# Patient Record
Sex: Male | Born: 1960 | Race: White | Hispanic: No | Marital: Married | Smoking: Never smoker
Health system: Southern US, Community
[De-identification: ages and names within clinical notes are randomized; demographics above are authoritative.]

## PROBLEM LIST (undated history)

## (undated) DIAGNOSIS — C801 Malignant (primary) neoplasm, unspecified: Secondary | ICD-10-CM

## (undated) DIAGNOSIS — K219 Gastro-esophageal reflux disease without esophagitis: Secondary | ICD-10-CM

## (undated) DIAGNOSIS — R519 Headache, unspecified: Secondary | ICD-10-CM

## (undated) DIAGNOSIS — R011 Cardiac murmur, unspecified: Secondary | ICD-10-CM

## (undated) DIAGNOSIS — R748 Abnormal levels of other serum enzymes: Secondary | ICD-10-CM

## (undated) HISTORY — PX: WISDOM TOOTH EXTRACTION: SHX21

---

## 2007-08-31 HISTORY — PX: COLONOSCOPY: SHX174

## 2011-04-25 ENCOUNTER — Emergency Department (HOSPITAL_COMMUNITY)
Admission: EM | Admit: 2011-04-25 | Discharge: 2011-04-26 | Disposition: A | Discharge status: Home / Self Care | Payer: BC Managed Care – PPO | Attending: Emergency Medicine | Admitting: Emergency Medicine

## 2011-04-25 DIAGNOSIS — K8020 Calculus of gallbladder without cholecystitis without obstruction: Secondary | ICD-10-CM | POA: Insufficient documentation

## 2011-04-25 DIAGNOSIS — R112 Nausea with vomiting, unspecified: Secondary | ICD-10-CM | POA: Insufficient documentation

## 2011-04-25 DIAGNOSIS — R1011 Right upper quadrant pain: Secondary | ICD-10-CM | POA: Insufficient documentation

## 2011-04-25 LAB — CBC
MCHC: 36.2 g/dL — ABNORMAL HIGH (ref 30.0–36.0)
MCV: 83.2 fL (ref 78.0–100.0)
Platelets: 150 10*3/uL (ref 150–400)
RDW: 12.9 % (ref 11.5–15.5)
WBC: 10.5 10*3/uL (ref 4.0–10.5)

## 2011-04-25 LAB — COMPREHENSIVE METABOLIC PANEL
AST: 40 U/L — ABNORMAL HIGH (ref 0–37)
Albumin: 4.1 g/dL (ref 3.5–5.2)
Calcium: 9.5 mg/dL (ref 8.4–10.5)
Chloride: 105 mEq/L (ref 96–112)
Creatinine, Ser: 0.93 mg/dL (ref 0.50–1.35)
Total Bilirubin: 1.2 mg/dL (ref 0.3–1.2)
Total Protein: 7.1 g/dL (ref 6.0–8.3)

## 2011-04-25 LAB — DIFFERENTIAL
Basophils Absolute: 0 10*3/uL (ref 0.0–0.1)
Eosinophils Absolute: 0.1 10*3/uL (ref 0.0–0.7)
Eosinophils Relative: 1 % (ref 0–5)
Lymphs Abs: 1.2 10*3/uL (ref 0.7–4.0)

## 2011-04-25 LAB — POCT I-STAT TROPONIN I: Troponin i, poc: 0.07 ng/mL (ref 0.00–0.08)

## 2011-04-26 ENCOUNTER — Emergency Department (HOSPITAL_COMMUNITY): Payer: BC Managed Care – PPO

## 2011-05-10 ENCOUNTER — Ambulatory Visit (INDEPENDENT_AMBULATORY_CARE_PROVIDER_SITE_OTHER): Payer: BC Managed Care – PPO | Admitting: Surgery

## 2011-05-10 ENCOUNTER — Encounter (INDEPENDENT_AMBULATORY_CARE_PROVIDER_SITE_OTHER): Payer: Self-pay | Admitting: Surgery

## 2011-05-10 VITALS — BP 120/88 | HR 64 | Temp 97.6°F | Ht 66.5 in | Wt 161.8 lb

## 2011-05-10 DIAGNOSIS — K8010 Calculus of gallbladder with chronic cholecystitis without obstruction: Secondary | ICD-10-CM | POA: Insufficient documentation

## 2011-05-10 NOTE — Progress Notes (Signed)
Subjective:     Patient ID: Douglas Cole, male   DOB: Jan 05, 1961, 50 y.o.   MRN: [id number]  HPI  Reason for visit: Recurrent abdominal pain and nausea vomiting. A probable gallstone etiology.  Patient is a healthy male who's had a few intermittent attacks of abdominal pain. It had been at one time after having hotdogs. Another after grilled chicken. He noted diffuse upper abdominal pain. He was worse in the right side. He felt full and bloated. First episode he had nausea vomiting.  The second episode was just a fullness and tightness, but it lasted longer. He went to the emergency room. Workup showed gallstones. He had a mildly increased AST. Rest of workup is underwhelming. The history and ultrasound and exam findings were strongly suspicious for biliary colic. They sent this patient to me for evaluation.  He has a bowel movement every day. No history of inflammatory bowel disease, irritable bowel syndrome, polyps. Normal colonoscopy last year by [facility]. No sick contacts. No travel history. Minimal reflux. No heartburn. He is not taking medicine for any reflux. These attacks were different.  He has 2-3 glasses of wine a week at most. No family history of other GI problems.  History reviewed. No pertinent past medical history. History reviewed. No pertinent past surgical history. No current outpatient prescriptions on file. Allergies  Allergen Reactions  . Penicillins      Review of Systems  Constitutional: Negative for fever, chills and diaphoresis.  HENT: Negative for nosebleeds, sore throat, facial swelling, mouth sores, trouble swallowing and ear discharge.   Eyes: Negative for photophobia, discharge and visual disturbance.  Respiratory: Negative for choking, chest tightness, shortness of breath and stridor.   Cardiovascular: Negative for chest pain and palpitations.  Gastrointestinal: Positive for nausea, vomiting and abdominal pain. Negative for diarrhea, constipation, blood in  stool, abdominal distention, anal bleeding and rectal pain.  Genitourinary: Negative for dysuria, urgency, frequency, difficulty urinating and testicular pain.  Musculoskeletal: Negative for myalgias, back pain, arthralgias and gait problem.  Skin: Negative for color change, pallor, rash and wound.  Neurological: Negative for dizziness, speech difficulty, weakness, numbness and headaches.  Hematological: Negative for adenopathy. Does not bruise/bleed easily.  Psychiatric/Behavioral: Negative for hallucinations, confusion and agitation.       Objective:   Physical Exam  Constitutional: He is oriented to person, place, and time. He appears well-developed and well-nourished. No distress.  HENT:  Head: Normocephalic.  Mouth/Throat: Oropharynx is clear and moist. No oropharyngeal exudate.  Eyes: Conjunctivae and EOM are normal. Pupils are equal, round, and reactive to light. No scleral icterus.  Neck: Normal range of motion. Neck supple. No tracheal deviation present.  Cardiovascular: Normal rate, regular rhythm and intact distal pulses.   Pulmonary/Chest: Effort normal and breath sounds normal. No respiratory distress.  Abdominal: Soft. He exhibits no distension and no mass. There is no rebound and no guarding. Hernia confirmed negative in the right inguinal area and confirmed negative in the left inguinal area.       MIld RUQ TTP subcostal   Musculoskeletal: Normal range of motion. He exhibits no tenderness.  Lymphadenopathy:    He has no cervical adenopathy.       Right: No inguinal adenopathy present.       Left: No inguinal adenopathy present.  Neurological: He is alert and oriented to person, place, and time. No cranial nerve deficit. He exhibits normal muscle tone. Coordination normal.  Skin: Skin is warm and dry. No rash noted. He  is not diaphoretic. No erythema. No pallor.  Psychiatric: He has a normal mood and affect. His behavior is normal. Judgment and thought content normal.        Assessment:     Probable chronic cholecystitis with gallstones    Plan:     I think he would benefit from surgery. He is a good candidate for a single site approach. I do not think he needs a GI evaluation at this time.  The anatomy & physiology of hepatobiliary & pancreatic function was discussed.  The pathophysiology of gallbladder dysfunction was discussed.  Natural history risks without surgery was discussed.   I feel the risks of no intervention will lead to serious problems that outweigh the operative risks; therefore, I recommended cholecystectomy to remove the pathology.  I explained laparoscopic techniques with possible need for an open approach.  Probable cholangiogram to evaluate the bilary tract was explained as well.    Risks such as bleeding, infection, abscess, leak, injury to other organs, need for further treatment, heart attack, death, and other risks were discussed.  Possibility that this will not correct all abdominal symptoms was explained.  Goals of post-operative recovery were discussed as well.  We will work to minimize complications.  An educational handout further explaining the pathology and treatment options was given as well.  Questions were answered.  The patient expresses understanding & wishes to proceed with surgery.

## 2011-05-10 NOTE — Patient Instructions (Signed)
See Handout  Consider Surgery

## 2011-05-28 ENCOUNTER — Ambulatory Visit (HOSPITAL_COMMUNITY): Payer: BC Managed Care – PPO

## 2011-05-28 ENCOUNTER — Other Ambulatory Visit (INDEPENDENT_AMBULATORY_CARE_PROVIDER_SITE_OTHER): Payer: Self-pay | Admitting: Surgery

## 2011-05-28 ENCOUNTER — Ambulatory Visit (HOSPITAL_COMMUNITY)
Admission: RE | Admit: 2011-05-28 | Discharge: 2011-05-30 | Disposition: A | Discharge status: Home / Self Care | Payer: BC Managed Care – PPO | Source: Ambulatory Visit | Attending: Surgery | Admitting: Surgery

## 2011-05-28 DIAGNOSIS — K8064 Calculus of gallbladder and bile duct with chronic cholecystitis without obstruction: Secondary | ICD-10-CM | POA: Insufficient documentation

## 2011-05-28 DIAGNOSIS — Z79899 Other long term (current) drug therapy: Secondary | ICD-10-CM | POA: Insufficient documentation

## 2011-05-28 DIAGNOSIS — K8060 Calculus of gallbladder and bile duct with cholecystitis, unspecified, without obstruction: Secondary | ICD-10-CM | POA: Insufficient documentation

## 2011-05-28 DIAGNOSIS — K824 Cholesterolosis of gallbladder: Secondary | ICD-10-CM

## 2011-05-28 DIAGNOSIS — K8010 Calculus of gallbladder with chronic cholecystitis without obstruction: Secondary | ICD-10-CM

## 2011-05-28 HISTORY — PX: CHOLECYSTECTOMY: SHX55

## 2011-05-28 LAB — CBC
HCT: 39.3 % (ref 39.0–52.0)
Hemoglobin: 13.7 g/dL (ref 13.0–17.0)
MCV: 84.3 fL (ref 78.0–100.0)
RBC: 4.66 MIL/uL (ref 4.22–5.81)
WBC: 4.4 10*3/uL (ref 4.0–10.5)

## 2011-05-28 LAB — COMPREHENSIVE METABOLIC PANEL
CO2: 27 mEq/L (ref 19–32)
Chloride: 102 mEq/L (ref 96–112)
Glucose, Bld: 98 mg/dL (ref 70–99)
Potassium: 3.7 mEq/L (ref 3.5–5.1)
Sodium: 139 mEq/L (ref 135–145)
Total Bilirubin: 0.8 mg/dL (ref 0.3–1.2)

## 2011-05-28 LAB — SURGICAL PCR SCREEN: Staphylococcus aureus: NEGATIVE

## 2011-05-29 ENCOUNTER — Ambulatory Visit (HOSPITAL_COMMUNITY): Payer: BC Managed Care – PPO

## 2011-05-29 LAB — CBC
HCT: 40 % (ref 39.0–52.0)
Hemoglobin: 13.9 g/dL (ref 13.0–17.0)
MCV: 84.6 fL (ref 78.0–100.0)
Platelets: 140 10*3/uL — ABNORMAL LOW (ref 150–400)
RBC: 4.73 MIL/uL (ref 4.22–5.81)
WBC: 10.9 10*3/uL — ABNORMAL HIGH (ref 4.0–10.5)

## 2011-05-29 LAB — HEPATIC FUNCTION PANEL
ALT: 104 U/L — ABNORMAL HIGH (ref 0–53)
AST: 80 U/L — ABNORMAL HIGH (ref 0–37)
Bilirubin, Direct: 0.3 mg/dL (ref 0.0–0.3)

## 2011-05-29 LAB — PROTIME-INR
INR: 1.15 (ref 0.00–1.49)
Prothrombin Time: 14.9 seconds (ref 11.6–15.2)

## 2011-05-29 LAB — CREATININE, SERUM: GFR calc Af Amer: >60 mL/min (ref >60)

## 2011-05-29 LAB — TYPE AND SCREEN

## 2011-05-30 LAB — CBC
HCT: 33.9 % — ABNORMAL LOW (ref 39.0–52.0)
Hemoglobin: 11.6 g/dL — ABNORMAL LOW (ref 13.0–17.0)
MCH: 29.4 pg (ref 26.0–34.0)
MCHC: 34.2 g/dL (ref 30.0–36.0)

## 2011-05-30 LAB — COMPREHENSIVE METABOLIC PANEL
BUN: 16 mg/dL (ref 6–23)
Calcium: 8.9 mg/dL (ref 8.4–10.5)
Creatinine, Ser: 0.79 mg/dL (ref 0.50–1.35)
GFR calc Af Amer: >60 mL/min (ref >60)
Glucose, Bld: 119 mg/dL — ABNORMAL HIGH (ref 70–99)
Sodium: 136 mEq/L (ref 135–145)
Total Protein: 6.1 g/dL (ref 6.0–8.3)

## 2011-06-10 ENCOUNTER — Encounter (INDEPENDENT_AMBULATORY_CARE_PROVIDER_SITE_OTHER): Payer: Self-pay

## 2011-06-14 ENCOUNTER — Ambulatory Visit (INDEPENDENT_AMBULATORY_CARE_PROVIDER_SITE_OTHER): Payer: BC Managed Care – PPO | Admitting: Surgery

## 2011-06-14 ENCOUNTER — Encounter (INDEPENDENT_AMBULATORY_CARE_PROVIDER_SITE_OTHER): Payer: Self-pay | Admitting: Surgery

## 2011-06-14 VITALS — BP 118/88 | HR 64 | Temp 97.5°F | Resp 20 | Ht 67.0 in | Wt 161.1 lb

## 2011-06-14 DIAGNOSIS — K8050 Calculus of bile duct without cholangitis or cholecystitis without obstruction: Secondary | ICD-10-CM

## 2011-06-14 DIAGNOSIS — K8010 Calculus of gallbladder with chronic cholecystitis without obstruction: Secondary | ICD-10-CM

## 2011-06-14 NOTE — Consult Note (Signed)
  NAMEROLAND, LIPKE NO.:  [id number]  MEDICAL RECORD NO.:  [id number]  LOCATION:  1523                         FACILITY:  [facility]  PHYSICIAN:  Shirley Friar, MDDATE OF BIRTH:  06-23-1961  DATE OF CONSULTATION: DATE OF DISCHARGE:                                CONSULTATION   REQUESTING PHYSICIAN:  Ardeth Sportsman, MD  INDICATIONS:  Common bile duct stones.  HISTORY OF PRESENT ILLNESS:  Mr. Rentz is a pleasant 50 year old white male who underwent a laparoscopic cholecystectomy yesterday due to symptomatic gallstones.  He had a positive intraoperative cholangiogram, which showed multiple small filling defects in the distal common bile duct with mild biliary ductal dilation of the common bile duct.  Two weeks ago, he had a mildly elevated AST of 40, otherwise normal ALT 24, ALP 47, and T bili at upper limit of normal is 1.2.  Preoperatively, his liver function tests were within normal limits.  Postoperatively today, his T bili is 1.6, ALP 62, AST 80, ALT 104, and lipase 12.  He feels okay postoperatively with some mild tenderness mainly in the lower quadrants.  PAST MEDICAL HISTORY:  Negative.  ALLERGIES:  PENICILLIN causes hives.  MEDICATIONS:  Currently, Tylenol as needed, Robinul as needed, Toradol as needed, Maalox as needed, morphine as needed, Zofran as needed, and Percocet as needed.  Doses listed in the hospital record.  FAMILY HISTORY:  Noncontributory.  SOCIAL HISTORY:  Positive alcohol, denies smoking.  REVIEW OF SYSTEMS:  Negative from GI standpoint except as stated above.  PHYSICAL EXAMINATION:  VITAL SIGNS:  Temperature 98, pulse 82, blood pressure 114/75, O2 sat 98% on room air. GENERAL:  Alert, well-nourished, in no acute stress ABDOMEN:  Mild tenderness with guarding in upper quadrants, soft, nondistended, and positive bowel sounds.  LABORATORY DATA:  White blood count 10.9, hemoglobin 13.9, platelet count 140, INR 1.15, T bili  1.6, ALP 62, AST 80, ALT 104, and lipase 12.  IMPRESSION:  A 50 year old white male with a positive intraoperative cholangiogram following a laparoscopic cholecystectomy yesterday that showed multiple small filling defects likely common bile duct stones. He needs ERCP, which will be done today to remove the stones.  Risks and benefits of procedure were discussed with Mr. Corter and his wife and they agreed to proceed.  We will give him Cipro and call endoscopy due to a penicillin allergy, which could have possible cross reactivity with cephalosporins.  Continue IV fluids, n.p.o.  ERCP will be done by Dr. Evette Cristal.     Shirley Friar, MD     VCS/MEDQ  D:  05/29/2011  T:  05/29/2011  Job:  [id number]  cc:   Ardeth Sportsman, MD [address] [place] [address]  Graylin Shiver, M.D. Fax: [phone]  Electronically Signed by Charlott Rakes MD on 06/14/2011 07:54:32 PM

## 2011-06-14 NOTE — Progress Notes (Signed)
Subjective:     Patient ID: Douglas Cole, male   DOB: [birth year], 50 y.o.   MRN: [id number]  HPI  Patient Care Team: Dibas Koirala as PCP - General (Family Medicine) Freddy Jaksch, MD as Consulting Physician (Gastroenterology)  This patient is a 50 y.o.male who presents today for surgical evaluation.   Diagnosis: Chronic cholecystitis and common bile duct stones  Procedure: Single site laparoscopic cholecystectomy with postoperative ERCP  The patient comes today feeling okay. Appetite coming back. Minimal pain. Minimal drainage at the bellybutton covered with a daily Band-Aid. Bowels moving regularly. Tolerating a regular diet well. Back to work. No episodes of jaundice  History reviewed. No pertinent past medical history.  Past Surgical History  Procedure Date  . Cholecystectomy 05/28/11     lap chole     History   Social History  . Marital Status: Married    Spouse Name: N/A    Number of Children: N/A  . Years of Education: N/A   Occupational History  . Not on file.   Social History Main Topics  . Smoking status: Never Smoker   . Smokeless tobacco: Not on file  . Alcohol Use: Yes  . Drug Use: No  . Sexually Active:    Other Topics Concern  . Not on file   Social History Narrative  . No narrative on file    Family History  Problem Relation Age of Onset  . Cancer Mother     kidney    No current outpatient prescriptions on file.  Allergies  Allergen Reactions  . Penicillins        Review of Systems  Constitutional: Negative for fever, chills and diaphoresis.  HENT: Negative for sore throat, trouble swallowing and neck pain.   Eyes: Negative for photophobia and visual disturbance.  Respiratory: Negative for choking and shortness of breath.   Cardiovascular: Negative for chest pain and palpitations.  Gastrointestinal: Negative for nausea, vomiting, abdominal distention, anal bleeding and rectal pain.  Genitourinary: Negative for dysuria, urgency,  difficulty urinating and testicular pain.  Musculoskeletal: Negative for myalgias, arthralgias and gait problem.  Skin: Negative for color change and rash.  Neurological: Negative for dizziness, speech difficulty, weakness and numbness.  Hematological: Negative for adenopathy.  Psychiatric/Behavioral: Negative for hallucinations, confusion and agitation.       Objective:   Physical Exam  Constitutional: He is oriented to person, place, and time. He appears well-developed and well-nourished. No distress.  HENT:  Head: Normocephalic.  Mouth/Throat: Oropharynx is clear and moist. No oropharyngeal exudate.  Eyes: Conjunctivae and EOM are normal. Pupils are equal, round, and reactive to light. No scleral icterus.  Neck: Normal range of motion. No tracheal deviation present.  Cardiovascular: Normal rate, normal heart sounds and intact distal pulses.   Pulmonary/Chest: Effort normal. No respiratory distress.  Abdominal: Soft. He exhibits no distension. There is no tenderness. Hernia confirmed negative in the right inguinal area and confirmed negative in the left inguinal area.       Incisions clean with normal healing ridge.  Minimal moist scab.  No hernia, cellulitis, abscess  Musculoskeletal: Normal range of motion. He exhibits no tenderness.  Neurological: He is alert and oriented to person, place, and time. No cranial nerve deficit. He exhibits normal muscle tone. Coordination normal.  Skin: Skin is warm and dry. No rash noted. He is not diaphoretic.  Psychiatric: He has a normal mood and affect. His behavior is normal.       Assessment:  2-1/2 weeks status post cholecystectomy and ERCP for stones in gallbladder and common bile duct. Improving.    Plan:     Increase activity as tolerated.  Do not push through pain.  Dry cotton ball or dressing over incision to let it dry out. Return if it worsens or does not improve.  Advanced on diet as tolerated. Bowel regimen to avoid  problems.  Return to clinic p.r.n. The patient expressed understanding and appreciation

## 2011-06-14 NOTE — Patient Instructions (Signed)
Increase activity as tolerated.  Do not push through pain.  Advanced on diet as tolerated. Bowel regimen to avoid problems.

## 2011-06-17 NOTE — Op Note (Signed)
[name], Cole NO.:  [id number]  MEDICAL RECORD NO.:  [id number]  LOCATION:  1523                         FACILITY:  [facility]  PHYSICIAN:  Ardeth Sportsman, MD     DATE OF BIRTH:  27-Aug-1961  DATE OF PROCEDURE:  05/28/2011 DATE OF DISCHARGE:                              OPERATIVE REPORT   PRIMARY CARE PHYSICIAN:  Dibas Koirala, MD.  SURGEON:  Ardeth Sportsman, MD.  ASSISTANT:  RN.  PREOPERATIVE DIAGNOSIS:  Symptomatic cholecystolithiasis with probable chronic cholecystitis.  POSTOPERATIVE DIAGNOSES: 1. Chronic cholecystitis with gall stones. 2. Choledocholithiasis, not fully obstruction with biliary dilation.  PROCEDURES PERFORMED:  Laparoscopic cholecystectomy with intraoperative cholangiogram (single-site technique).  SPECIMEN:  Gallbladder.  DRAINS:  None.  ESTIMATED BLOOD LOSS:  Minimal.  COMPLICATIONS:  None.  INDICATIONS:  Douglas Cole is a 50 year old male who has had a few intermittent attacks of abdominal pain, especially after protein.  It usually has been upper abdominal, but it has become more focussed within the right side.  He has had nausea and vomiting associated with it.  He had an elevated AST after a visit to ER.  Ultrasound did not show ductal dilatation and showed sludge and/or stones.  He was sent to me for surgical evaluation.  His differential diagnosis did not seem very likely.  The anatomy and physiology of hepatobiliary and pancreatic function was discussed.  Pathophysiology of gallstones with her natural history and risks of cholecystitis was discussed.  Options were discussed. Recommendation is made for her laparoscopic cholecystectomy with intraoperative cholangiogram.  Risks, benefits, and alternatives were discussed.  I thought he would be reasonable candidate for a single-site technique versus conversion to standard multiple port.  Possible single site versus open was discussed.  Risks, benefits, and alternatives  were discussed.  Questions answered and he agreed to proceed.  OPERATIVE FINDINGS:  He had a dilated bulky gallbladder.  He had some sludge and may be a few stones in the gallbladder.  His liver otherwise looked normal.  He had moderate adhesions to the gallbladder concerning for probable chronic cholecystitis.  He did have a diffusely dilated biliary system.  He had many polyhedral irregular lucencies in his mid and distal common bile duct strongly suspicious for common bile duct stones.  Contrast could get into the duodenum without much difficulty.  DESCRIPTION OF PROCEDURE:  Informed consent was confirmed.  Patient underwent general anesthesia without any difficulty.  He was positioned supine with arms tucked.  He had sequential compression devices active during the entire case.  Abdomen was clipped, prepped, and draped in sterile fashion.  Surgical time-out confirmed our plan.  I made a curvilinear incision to the superior part of the umbilicus.  I did a cut down and placed a #5 long port through the supraumbilical fascia just superior to the umbilicus.  Entering was clean.  I induced carbon dioxide insufflation.  Camera inspection revealed no injury.  I did inspection and saw no adhesions to the anterior abdominal wall circumferentially.  I could see omentum here in the right upper quadrant.  I placed a 5 mm port in the left upper aspect of the wound and  5 mm grasper in the right inferior aspect of the wound.  I turned attention towards the right upper quadrant.  I could not grab the gallbladder initially as it was too dilated.  I found greater omentum and epiploic appendages of the transverse colon adhering to the gallbladder.  I freed that off using focused ultrasonic dissection and blunt dissection.  Eventually, I freed enough off, so I could grab the gallbladder and better elevate it cephalad.  I freed the greater omentum, transverse colon, and duodenum off the anterior  wall of the gallbladder to better elevate in cephalad.  I freed the peritoneal attachments in the gallbladder and the liver and the posterior lateral wall and came around anteriorly.  I  alternated between blunt [place] dissection and focussed ultrasonic dissection to help mobilize proximal half of the gallbladder off the liver bed.  I got a good critical view and ligated dominant cystic artery high on the gallbladder side.  I did further skeletonization and skeletonized about 3 cm of cystic duct which was not particularly enlarged.  I placed a clip on the infundibulum and did a partial cystic ductotomy. I passed a #5-French cholangiogram catheter through the puncture site in the right subcostal ridge into the cystic duct.  I ran a cholangiogram using dilute radiopaque contrast and continued with fluoroscopy.  The contrast flowed well from the side branch consistent with cystic duct cannulation and entered into a dilated biliary system and refluxed into the right and left enterohepatic chains. It came down the common bile duct and there were some obvious polyhedral lucencies, 2 to 5 mm in size.  They seemed consistent with stones.  I did get some contrast going into the duodenum although it was not frankly brisk.  I re-ran the cholangiogram after doing some flushing, it was noted that the stones persisted.  Because the stones were on the larger side and the cystic duct was not particularly dilated, I did not think it would be a good idea to try transcystic common bile duct exploration, and therefore decided to consider postoperative ERCT.  I clipped the cystic duct with 4 clips.  I transected the gallbladder off the cystic duct.  I ligated the cystic duct stump with a 0 PDS Endoloop.  I freed the gallbladder from its remaining attachments off the liver bed.  I did meticulous hemostasis with spatula tip cautery on the gallbladder fossa of the liver.  I did copious irrigation over  a liter of saline with good clear return.  There was good hemostasis and no evidence of any injuries to other organs.  I grabbed the gallbladder and removed it out of the midline supraumbilical wound after having have to sharply open up the fascia transversally to 1.5 cm to get the second gallbladder wall out and after aspirating the bile to help decompress the gallbladder to finally get that out as well.  There were few stones in the gallbladder as well.  I did copious irrigation of the umbilical wound.  I reapproximated the fascia using 0 Vicryl interrupted sutures transversally and closed the skin 4-0 Monocryl stitch.  Sterile dressing was applied.  The patient was extubated and taken to the recovery room in stable .  I am going to try and reach Gastroenterology to see about ERCP at this admission or soon.  I also discussed the operative findings with patient's family.     Ardeth Sportsman, MD     SCG/MEDQ  D:  05/28/2011  T:  05/29/2011  Job:  [id number]  cc:   Darrow Bussing, MD Fax: [phone]  Electronically Signed by Karie Soda MD on 06/17/2011 11:07:33 AM

## 2011-06-23 ENCOUNTER — Encounter (INDEPENDENT_AMBULATORY_CARE_PROVIDER_SITE_OTHER): Payer: BC Managed Care – PPO | Admitting: Surgery

## 2012-02-16 IMAGING — RF DG ERCP WO/W SPHINCTEROTOMY
8 series · 15 of 15 positions shown · non-contrast
Comparison: Intraoperative cholangiogram 05/28/2011.

CLINICAL DATA: Possible common duct stone.

ERCP
TECHNIQUE: Multiple spot images obtained with the fluoroscopic
device and submitted for interpretation post-procedure.  ERCP was
performed by Dr. Diljith.

[Series 1: cont. · 1 of 1 slices shown (1 of 8)]
[im 1/1]
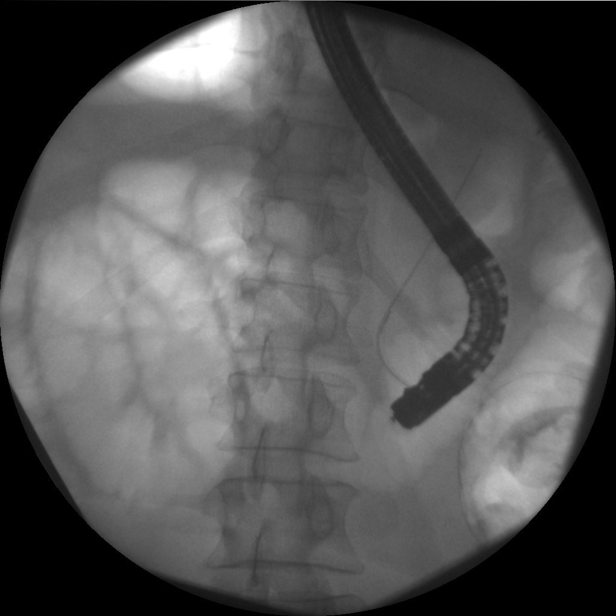

[Series 2: cont. · 2 of 2 slices shown (2 of 8)]
[im 1/2]
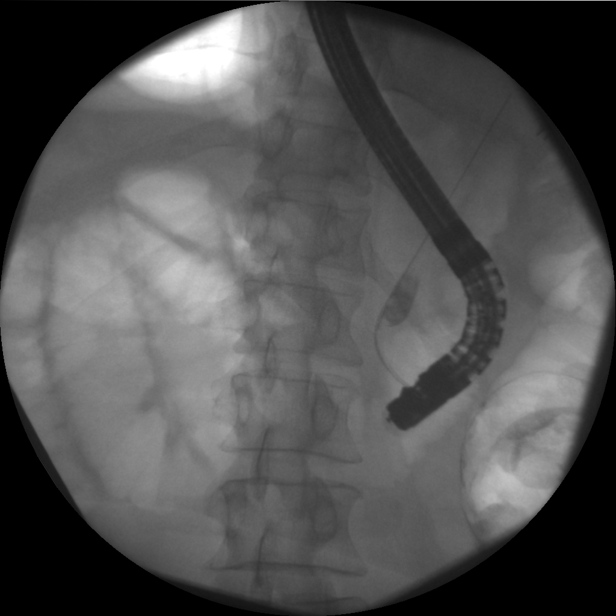
[im 2/2]
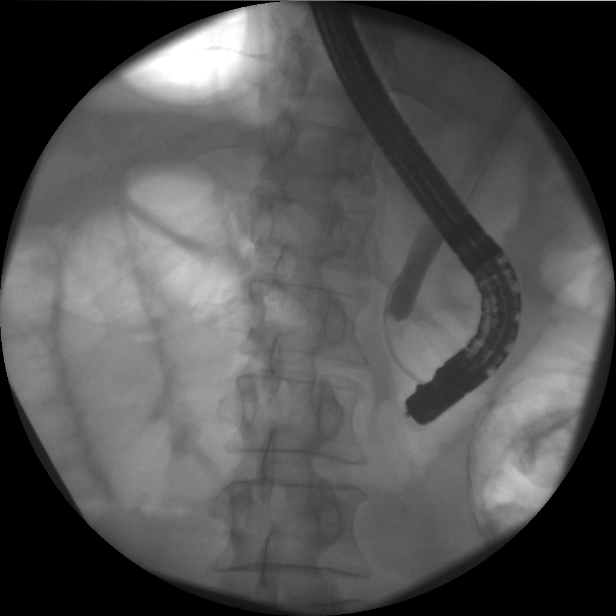

[Series 3: cont. · 1 of 1 slices shown (3 of 8)]
[im 1/1]
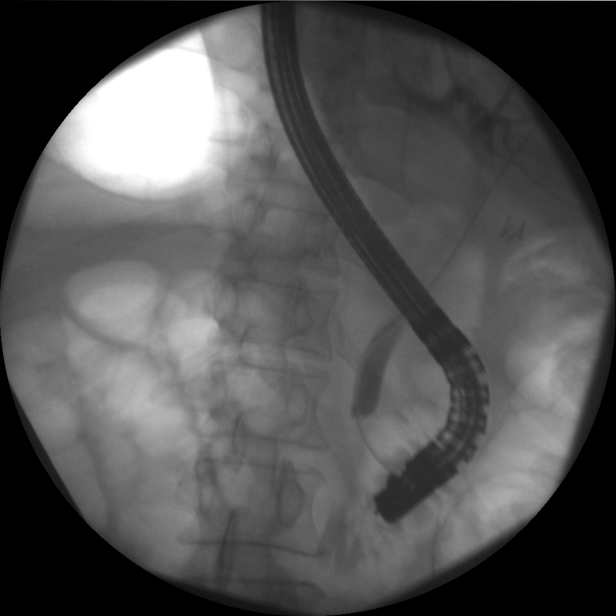

[Series 4: cont. · 2 of 2 slices shown (4 of 8)]
[im 1/2]
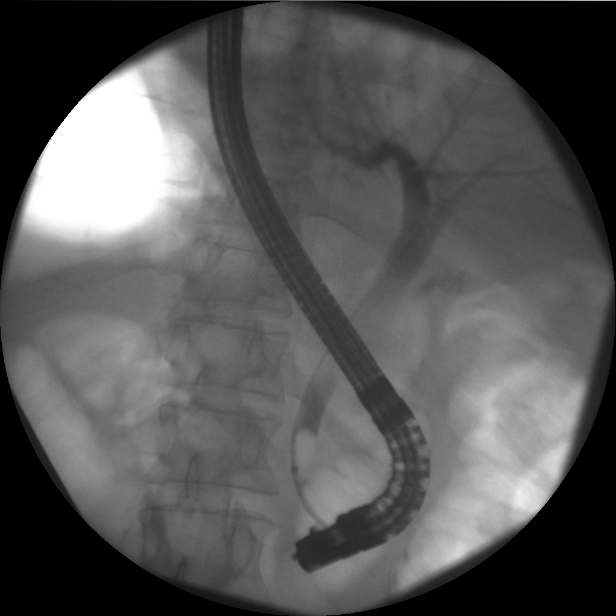
[im 2/2]
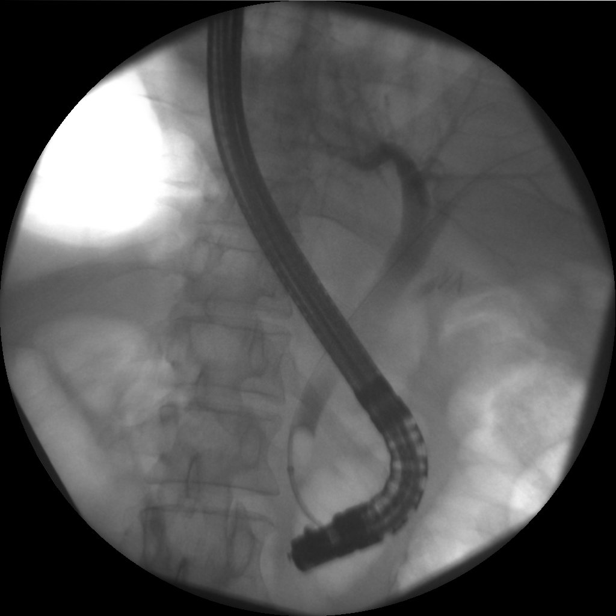

[Series 5: cont. · 1 of 1 slices shown (5 of 8)]
[im 1/1]
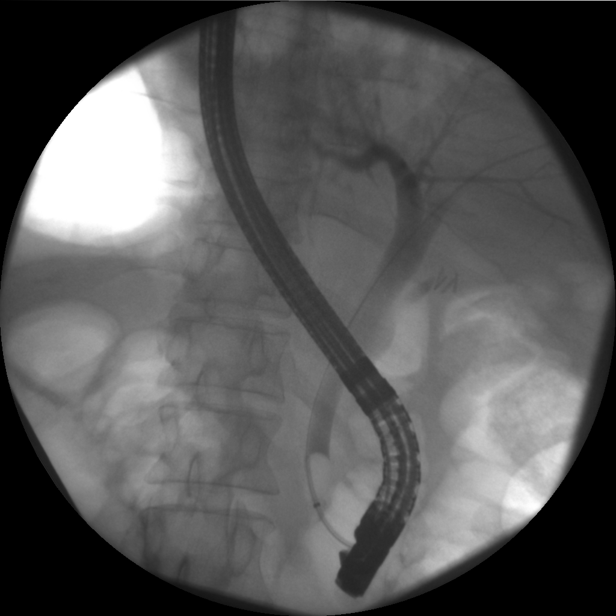

[Series 6: cont. · 3 of 3 slices shown (6 of 8)]
[im 1/3]
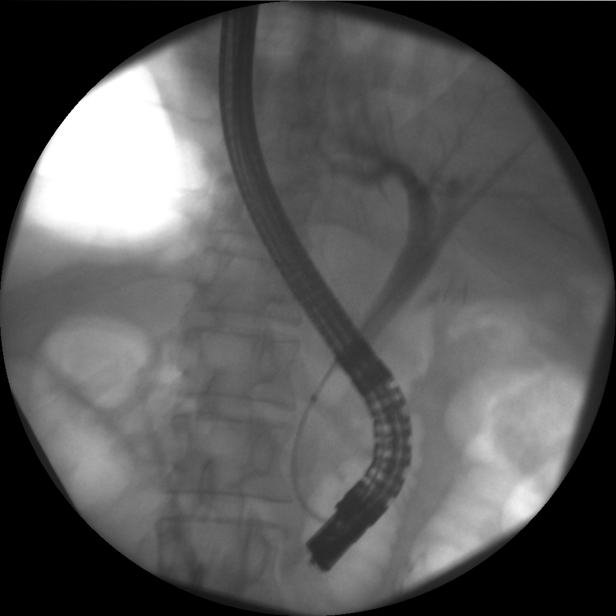
[im 2/3]
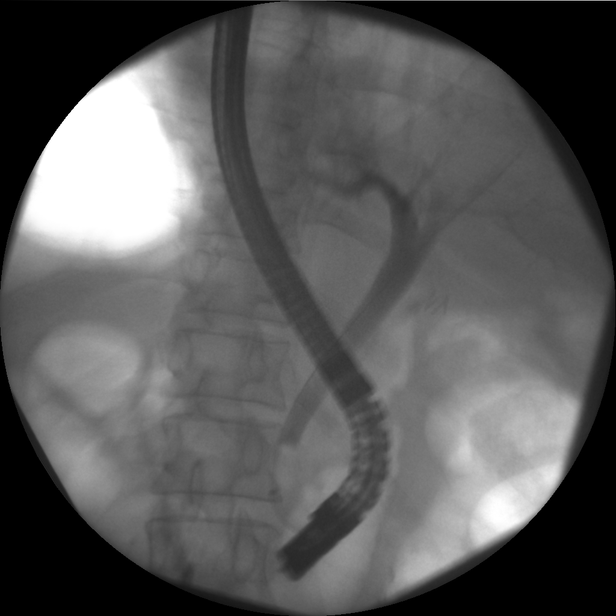
[im 3/3]
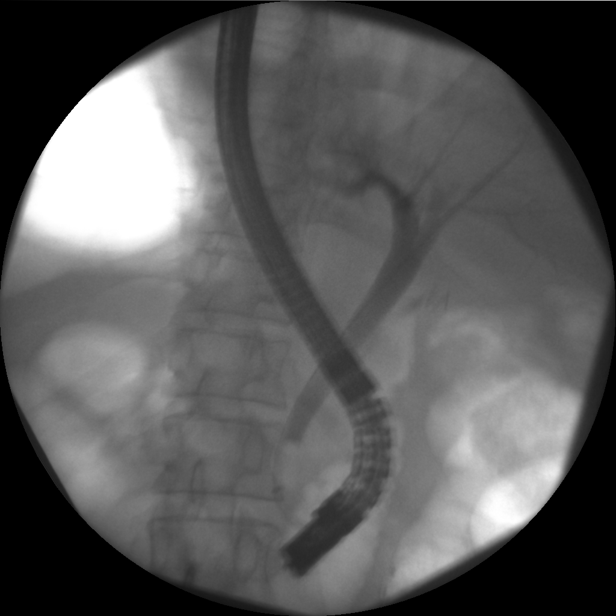

[Series 7: cont. · 4 of 4 slices shown (7 of 8)]
[im 1/4]
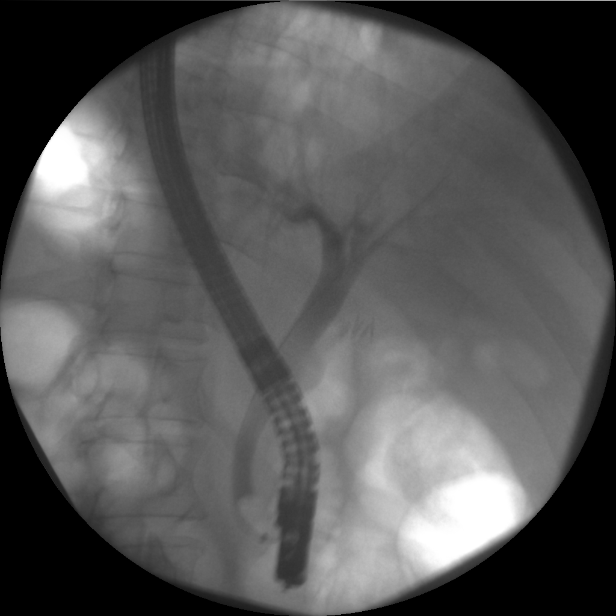
[im 2/4]
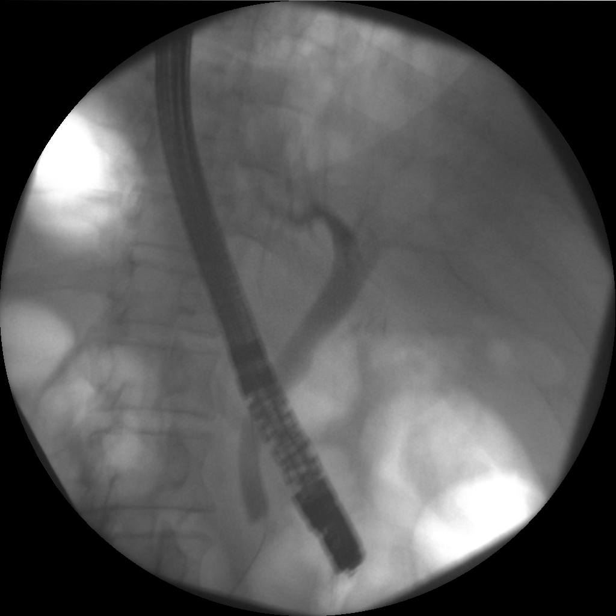
[im 3/4]
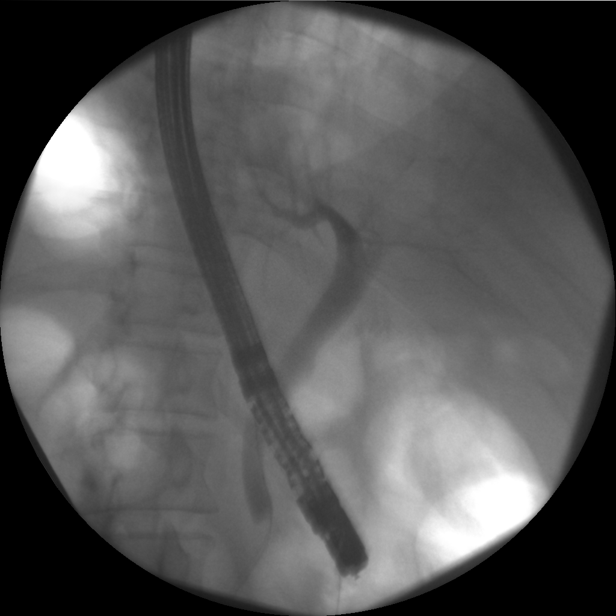
[im 4/4]
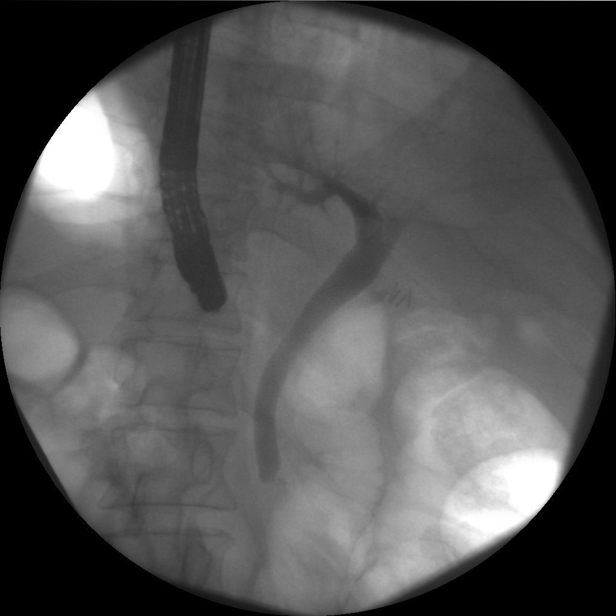

[Series 8: cont. · 1 of 1 slices shown (8 of 8)]
[im 1/1]
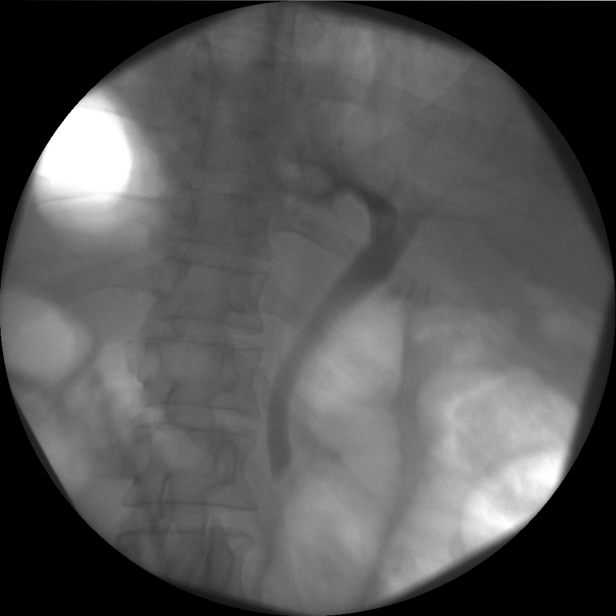

[15 of 15 positions shown; findings below may reference images not displayed]

FINDINGS: We are provided with a series of fluoroscopic spot views
from endoscopy.  Images demonstrate an endoscope in place.  The
common bile duct is cannulated and balloon sweep was performed.
Filling defect in the distal common duct seen on the second image
is no longer visualized at the end of the exam.
IMPRESSION: ERCP as above.

These images were submitted for radiologic interpretation only.
Please see the procedural report for the amount of contrast and the
fluoroscopy time utilized.

## 2012-05-31 ENCOUNTER — Ambulatory Visit (INDEPENDENT_AMBULATORY_CARE_PROVIDER_SITE_OTHER): Payer: BC Managed Care – PPO | Admitting: Surgery

## 2012-05-31 VITALS — BP 126/80 | HR 90 | Temp 98.6°F | Resp 18 | Ht 67.0 in | Wt 164.8 lb

## 2012-05-31 DIAGNOSIS — K601 Chronic anal fissure: Secondary | ICD-10-CM | POA: Insufficient documentation

## 2012-05-31 DIAGNOSIS — K602 Anal fissure, unspecified: Secondary | ICD-10-CM

## 2012-05-31 HISTORY — DX: Chronic anal fissure: K60.1

## 2012-05-31 MED ORDER — AMBULATORY NON FORMULARY MEDICATION: 1.0000 "application " | Freq: Four times a day (QID) | Status: DC

## 2012-05-31 NOTE — Progress Notes (Signed)
Subjective:     Patient ID: Douglas Cole, male   DOB: 1960/12/30, 51 y.o.   MRN: [id number]  HPI  Douglas Cole  08/12/1961 [id number]  Patient Care Team: Darrow Bussing, MD as PCP - General (Family Medicine) Willis Modena, MD as Consulting Physician (Gastroenterology)  This patient is a 51 y.o.male who presents today for surgical evaluation at the request of Dr. Docia Chuck.   Reason for evaluation: Anal pain.  Probable hemorrhoids.  Pleasant male.  Struggled with intermittent anal pain for several years.  More recently has become frustrating.  Notes he feels like he is " crapping glass".  Some blood in stools.  Tries to avoid constipation.  No history of IBS.  Has tried Anusol cream without much help.  Has softer stools now.  Still having pain.  Seems to be more on the tailbone side.  No personal nor family history of GI/colon cancer, inflammatory bowel disease, irritable bowel syndrome, allergy such as Celiac Sprue, dietary/dairy problems, colitis, ulcers nor gastritis.    No recent sick contacts/gastroenteritis.  No travel outside the country.  No changes in diet.    Patient Active Problem List  Diagnosis  . Chronic anal fissure    No past medical history on file.  Past Surgical History  Procedure Date  . Cholecystectomy 05/28/11     lap chole     History   Social History  . Marital Status: Married    Spouse Name: N/A    Number of Children: N/A  . Years of Education: N/A   Occupational History  . Not on file.   Social History Main Topics  . Smoking status: Never Smoker   . Smokeless tobacco: Not on file  . Alcohol Use: Yes  . Drug Use: No  . Sexually Active:    Other Topics Concern  . Not on file   Social History Narrative  . No narrative on file    Family History  Problem Relation Age of Onset  . Cancer Mother     kidney    Current Outpatient Prescriptions  Medication Sig Dispense Refill  . AMBULATORY NON FORMULARY MEDICATION Place 1 application  rectally 4 (four) times daily. Diltiazem 2% compounded suspension.  1 Tube  2     Allergies  Allergen Reactions  . Penicillins     BP 126/80  Pulse 90  Temp 98.6 F (37 C) (Temporal)  Resp 18  Ht 5\' 7"  (1.702 m)  Wt 164 lb 12.8 oz (74.753 kg)  BMI 25.81 kg/m2  No results found.   Review of Systems  Constitutional: Negative for fever, chills and diaphoresis.  HENT: Negative for nosebleeds, sore throat, facial swelling, mouth sores, trouble swallowing and ear discharge.   Eyes: Negative for photophobia, discharge and visual disturbance.  Respiratory: Negative for choking, chest tightness, shortness of breath and stridor.   Cardiovascular: Negative for chest pain and palpitations.  Gastrointestinal: Positive for anal bleeding and rectal pain. Negative for nausea, vomiting, abdominal pain, diarrhea, constipation, blood in stool and abdominal distention.  Genitourinary: Negative for dysuria, urgency, difficulty urinating and testicular pain.  Musculoskeletal: Negative for myalgias, back pain, arthralgias and gait problem.  Skin: Negative for color change, pallor, rash and wound.  Neurological: Negative for dizziness, speech difficulty, weakness, numbness and headaches.  Hematological: Negative for adenopathy. Does not bruise/bleed easily.  Psychiatric/Behavioral: Negative for hallucinations, confusion and agitation.       Objective:   Physical Exam  Constitutional: He is oriented to person, place, and  time. He appears well-developed and well-nourished. No distress.  HENT:  Head: Normocephalic.  Mouth/Throat: Oropharynx is clear and moist. No oropharyngeal exudate.  Eyes: Conjunctivae normal and EOM are normal. Pupils are equal, round, and reactive to light. No scleral icterus.  Neck: Normal range of motion. Neck supple. No tracheal deviation present.  Cardiovascular: Normal rate, regular rhythm and intact distal pulses.   Pulmonary/Chest: Effort normal and breath sounds  normal. No respiratory distress.  Abdominal: Soft. He exhibits no distension. There is no tenderness. Hernia confirmed negative in the right inguinal area and confirmed negative in the left inguinal area.  Genitourinary: Penis normal. No penile tenderness.       Exam done with assistance of male Medical Assistant in the room.  Perianal skin clean with good hygiene.  No pruritis.  No external skin tags / hemorrhoids of significance.  No pilonidal disease.  No abscess/fistula.    Posterior midline anal fissure with sentinel tag. Increased / High Normal sphincter tone.      Musculoskeletal: Normal range of motion. He exhibits no tenderness.  Lymphadenopathy:    He has no cervical adenopathy.       Right: No inguinal adenopathy present.       Left: No inguinal adenopathy present.  Neurological: He is alert and oriented to person, place, and time. No cranial nerve deficit. He exhibits normal muscle tone. Coordination normal.  Skin: Skin is warm and dry. No rash noted. He is not diaphoretic. No erythema. No pallor.  Psychiatric: He has a normal mood and affect. His behavior is normal. Judgment and thought content normal.       Assessment:     Chronic anal fissure with classic history    Plan:     The anatomy & physiology of the anorectal region was discussed.  The pathophysiology of anal fissure and differential diagnosis was discussed.  Natural history progression  was discussed.   I stressed the importance of a bowel regimen to have daily soft bowel movements to minimize progression of disease.   I discussed the use of warm soaks &  muscle relaxant, diltiazem, to help the anal sphincter relax, allow the spasming to stop, and help the tear/fissure to heal.  If non-operative treatment does not heal the fissure, I would recommend examination under anesthesia for better examination to confirm the diagnosis and treat by lateral internal sphincterotomy to allow the fissure to heal.  Technique,  benefits, alternatives discussed.  Risks such as bleeding, pain, incontinence, recurrence, heart attack, death, and other risks were discussed.    Educational handouts further explaining the pathology, treatment options, and bowel regimen were given as well.  The patient expressed understanding & will follow up PRN.  If the pain does not resolve in a few weeks or worsens, he should proceed with surgery.

## 2012-05-31 NOTE — Patient Instructions (Signed)
Anal Fissure, Adult An anal fissure is a small tear or crack in the skin around the anus. Bleeding from a fissure usually stops on its own within a few minutes. However, bleeding will often reoccur with each bowel movement until the crack heals.  CAUSES   Passing large, hard stools.  Frequent diarrheal stools.  Constipation.  Inflammatory bowel disease (Crohn's disease or ulcerative colitis).  Infections.  Anal sex. SYMPTOMS   Small amounts of blood seen on your stools, on toilet paper, or in the toilet after a bowel movement.  Rectal bleeding.  Painful bowel movements.  Itching or irritation around the anus. DIAGNOSIS Your caregiver will examine the anal area. An anal fissure can usually be seen with careful inspection. A rectal exam may be performed and a short tube (anoscope) may be used to examine the anal canal. TREATMENT   You may be instructed to take fiber supplements. These supplements can soften your stool to help make bowel movements easier.  Sitz baths may be recommended to help heal the tear. Do not use soap in the sitz baths.  Use diltiazem muscle relaxant cream, to help decrease anal sphincter spasm to allow the fissure to close HOME CARE INSTRUCTIONS   Maintain a diet high in fruits, whole grains, and vegetables. Avoid constipating foods like bananas and dairy products.  Take sitz baths as directed by your caregiver.  Drink enough fluids to keep your urine clear or pale yellow.  Only take over-the-counter or prescription medicines for pain, discomfort, or fever as directed by your caregiver. Do not take aspirin as this may increase bleeding.  Do not use ointments containing numbing medications (anesthetics) or hydrocortisone. They could slow healing. SEEK MEDICAL CARE IF:   Your fissure is not completely healed within 3 days.  You have further bleeding.  You have a fever.  You have diarrhea mixed with blood.  You have pain.  Your problem is  getting worse rather than better. MAKE SURE YOU:   Understand these instructions.  Will watch your condition.  Will get help right away if you are not doing well or get worse. Document Released: 08/16/2005 Document Revised: 11/08/2011 Document Reviewed: 01/31/2011 [facility] Patient Information 2013 [place], [place].  GETTING TO GOOD BOWEL HEALTH. Irregular bowel habits such as constipation and diarrhea can lead to many problems over time.  Having one soft bowel movement a day is the most important way to prevent further problems.  The anorectal canal is designed to handle stretching and feces to safely manage our ability to get rid of solid waste (feces, poop, stool) out of our body.  BUT, hard constipated stools can act like ripping concrete bricks and diarrhea can be a burning fire to this very sensitive area of our body, causing inflamed hemorrhoids, anal fissures, increasing risk is perirectal abscesses, abdominal pain/bloating, an making irritable bowel worse.     The goal: ONE SOFT BOWEL MOVEMENT A DAY!  To have soft, regular bowel movements:    Drink at least 8 tall glasses of water a day.     Take plenty of fiber.  Fiber is the undigested part of plant food that passes into the colon, acting s "natures broom" to encourage bowel motility and movement.  Fiber can absorb and hold large amounts of water. This results in a larger, bulkier stool, which is soft and easier to pass. Work gradually over several weeks up to 6 servings a day of fiber (25g a day even more if needed) in the form  of: o Vegetables -- Root (potatoes, carrots, turnips), leafy green (lettuce, salad greens, celery, spinach), or cooked high residue (cabbage, broccoli, etc) o Fruit -- Fresh (unpeeled skin & pulp), Dried (prunes, apricots, cherries, etc ),  or stewed ( applesauce)  o Whole grain breads, pasta, etc (whole wheat)  o Bran cereals    Bulking Agents -- This type of water-retaining fiber generally is easily obtained  each day by one of the following:  o Psyllium bran -- The psyllium plant is remarkable because its ground seeds can retain so much water. This product is available as Metamucil, Konsyl, Effersyllium, Per Diem Fiber, or the less expensive generic preparation in drug and health food stores. Although labeled a laxative, it really is not a laxative.  o Methylcellulose -- This is another fiber derived from wood which also retains water. It is available as Citrucel. o Polyethylene Glycol - and "artificial" fiber commonly called Miralax or Glycolax.  It is helpful for people with gassy or bloated feelings with regular fiber o Flax Seed - a less gassy fiber than psyllium   No reading or other relaxing activity while on the toilet. If bowel movements take longer than 5 minutes, you are too constipated   AVOID CONSTIPATION.  High fiber and water intake usually takes care of this.  Sometimes a laxative is needed to stimulate more frequent bowel movements, but    Laxatives are not a good long-term solution as it can wear the colon out. o Osmotics (Milk of Magnesia, Fleets phosphosoda, Magnesium citrate, MiraLax, GoLytely) are safer than  o Stimulants (Senokot, Castor Oil, Dulcolax, Ex Lax)    o Do not take laxatives for more than 7days in a row.    IF SEVERELY CONSTIPATED, try a Bowel Retraining Program: o Do not use laxatives.  o Eat a diet high in roughage, such as bran cereals and leafy vegetables.  o Drink six (6) ounces of prune or apricot juice each morning.  o Eat two (2) large servings of stewed fruit each day.  o Take one (1) heaping tablespoon of a psyllium-based bulking agent twice a day. Use sugar-free sweetener when possible to avoid excessive calories.  o Eat a normal breakfast.  o Set aside 15 minutes after breakfast to sit on the toilet, but do not strain to have a bowel movement.  o If you do not have a bowel movement by the third day, use an enema and repeat the above steps.    Controlling  diarrhea o Switch to liquids and simpler foods for a few days to avoid stressing your intestines further. o Avoid dairy products (especially milk & ice cream) for a short time.  The intestines often can lose the ability to digest lactose when stressed. o Avoid foods that cause gassiness or bloating.  Typical foods include beans and other legumes, cabbage, broccoli, and dairy foods.  Every person has some sensitivity to other foods, so listen to our body and avoid those foods that trigger problems for you. o Adding fiber (Citrucel, Metamucil, psyllium, Miralax) gradually can help thicken stools by absorbing excess fluid and retrain the intestines to act more normally.  Slowly increase the dose over a few weeks.  Too much fiber too soon can backfire and cause cramping & bloating. o Probiotics (such as active yogurt, Align, etc) may help repopulate the intestines and colon with normal bacteria and calm down a sensitive digestive tract.  Most studies show it to be of mild help, though, and such products  can be costly. o Medicines:   Bismuth subsalicylate (ex. Kayopectate, Pepto Bismol) every 30 minutes for up to 6 doses can help control diarrhea.  Avoid if pregnant.   Loperamide (Immodium) can slow down diarrhea.  Start with two tablets (4mg  total) first and then try one tablet every 6 hours.  Avoid if you are having fevers or severe pain.  If you are not better or start feeling worse, stop all medicines and call your doctor for advice o Call your doctor if you are getting worse or not better.  Sometimes further testing (cultures, endoscopy, X-ray studies, bloodwork, etc) may be needed to help diagnose and treat the cause of the diarrhea. o

## 2012-06-09 ENCOUNTER — Encounter (INDEPENDENT_AMBULATORY_CARE_PROVIDER_SITE_OTHER): Payer: Self-pay

## 2013-12-19 ENCOUNTER — Emergency Department (HOSPITAL_COMMUNITY): Payer: BC Managed Care – PPO

## 2013-12-19 ENCOUNTER — Encounter (HOSPITAL_COMMUNITY): Payer: Self-pay | Admitting: Emergency Medicine

## 2013-12-19 ENCOUNTER — Inpatient Hospital Stay (HOSPITAL_COMMUNITY)
Admission: EM | Admit: 2013-12-19 | Discharge: 2013-12-22 | DRG: 392 | Disposition: A | Discharge status: Home / Self Care | Payer: BC Managed Care – PPO | Attending: Internal Medicine | Admitting: Internal Medicine

## 2013-12-19 DIAGNOSIS — K2900 Acute gastritis without bleeding: Principal | ICD-10-CM | POA: Diagnosis present

## 2013-12-19 DIAGNOSIS — K601 Chronic anal fissure: Secondary | ICD-10-CM

## 2013-12-19 DIAGNOSIS — Z880 Allergy status to penicillin: Secondary | ICD-10-CM

## 2013-12-19 DIAGNOSIS — R7309 Other abnormal glucose: Secondary | ICD-10-CM | POA: Diagnosis not present

## 2013-12-19 DIAGNOSIS — R7402 Elevation of levels of lactic acid dehydrogenase (LDH): Secondary | ICD-10-CM | POA: Diagnosis present

## 2013-12-19 DIAGNOSIS — R7989 Other specified abnormal findings of blood chemistry: Secondary | ICD-10-CM | POA: Diagnosis present

## 2013-12-19 DIAGNOSIS — K219 Gastro-esophageal reflux disease without esophagitis: Secondary | ICD-10-CM | POA: Diagnosis present

## 2013-12-19 DIAGNOSIS — R7401 Elevation of levels of liver transaminase levels: Secondary | ICD-10-CM | POA: Diagnosis present

## 2013-12-19 DIAGNOSIS — R000 Tachycardia, unspecified: Secondary | ICD-10-CM | POA: Diagnosis present

## 2013-12-19 DIAGNOSIS — G43909 Migraine, unspecified, not intractable, without status migrainosus: Secondary | ICD-10-CM

## 2013-12-19 DIAGNOSIS — Z7982 Long term (current) use of aspirin: Secondary | ICD-10-CM

## 2013-12-19 DIAGNOSIS — Z791 Long term (current) use of non-steroidal anti-inflammatories (NSAID): Secondary | ICD-10-CM

## 2013-12-19 DIAGNOSIS — R109 Unspecified abdominal pain: Secondary | ICD-10-CM | POA: Diagnosis present

## 2013-12-19 DIAGNOSIS — R17 Unspecified jaundice: Secondary | ICD-10-CM

## 2013-12-19 DIAGNOSIS — R748 Abnormal levels of other serum enzymes: Secondary | ICD-10-CM | POA: Diagnosis present

## 2013-12-19 DIAGNOSIS — Z79899 Other long term (current) drug therapy: Secondary | ICD-10-CM

## 2013-12-19 DIAGNOSIS — R319 Hematuria, unspecified: Secondary | ICD-10-CM | POA: Diagnosis present

## 2013-12-19 DIAGNOSIS — R740 Nonspecific elevation of levels of transaminase and lactic acid dehydrogenase [LDH]: Secondary | ICD-10-CM

## 2013-12-19 HISTORY — DX: Tachycardia, unspecified: R00.0

## 2013-12-19 HISTORY — DX: Unspecified abdominal pain: R10.9

## 2013-12-19 HISTORY — DX: Migraine, unspecified, not intractable, without status migrainosus: G43.909

## 2013-12-19 HISTORY — DX: Elevation of levels of liver transaminase levels: R74.01

## 2013-12-19 LAB — CBC WITH DIFFERENTIAL/PLATELET
Basophils Absolute: 0 10*3/uL (ref 0.0–0.1)
Basophils Relative: 0 % (ref 0–1)
EOS PCT: 0 % (ref 0–5)
Eosinophils Absolute: 0 10*3/uL (ref 0.0–0.7)
HCT: 41.3 % (ref 39.0–52.0)
Hemoglobin: 14.4 g/dL (ref 13.0–17.0)
LYMPHS ABS: 0.4 10*3/uL — AB (ref 0.7–4.0)
Lymphocytes Relative: 5 % — ABNORMAL LOW (ref 12–46)
MCH: 29 pg (ref 26.0–34.0)
MCHC: 34.9 g/dL (ref 30.0–36.0)
MCV: 83.3 fL (ref 78.0–100.0)
MONO ABS: 0.2 10*3/uL (ref 0.1–1.0)
Monocytes Relative: 3 % (ref 3–12)
Neutro Abs: 6.8 10*3/uL (ref 1.7–7.7)
Neutrophils Relative %: 92 % — ABNORMAL HIGH (ref 43–77)
PLATELETS: 104 10*3/uL — AB (ref 150–400)
RBC: 4.96 MIL/uL (ref 4.22–5.81)
RDW: 13.1 % (ref 11.5–15.5)
WBC: 7.4 10*3/uL (ref 4.0–10.5)

## 2013-12-19 LAB — URINALYSIS, ROUTINE W REFLEX MICROSCOPIC
Glucose, UA: NEGATIVE mg/dL
Hgb urine dipstick: NEGATIVE
KETONES UR: NEGATIVE mg/dL
NITRITE: NEGATIVE
PH: 6 (ref 5.0–8.0)
Protein, ur: 30 mg/dL — AB
SPECIFIC GRAVITY, URINE: 1.027 (ref 1.005–1.030)
Urobilinogen, UA: 4 mg/dL — ABNORMAL HIGH (ref 0.0–1.0)

## 2013-12-19 LAB — COMPREHENSIVE METABOLIC PANEL
ALBUMIN: 4.2 g/dL (ref 3.5–5.2)
ALT: 325 U/L — ABNORMAL HIGH (ref 0–53)
AST: 408 U/L — ABNORMAL HIGH (ref 0–37)
Alkaline Phosphatase: 142 U/L — ABNORMAL HIGH (ref 39–117)
BUN: 17 mg/dL (ref 6–23)
CO2: 27 mEq/L (ref 19–32)
CREATININE: 0.9 mg/dL (ref 0.50–1.35)
Calcium: 9.6 mg/dL (ref 8.4–10.5)
Chloride: 99 mEq/L (ref 96–112)
GFR calc Af Amer: >90 mL/min (ref >90)
GFR calc non Af Amer: >90 mL/min (ref >90)
Glucose, Bld: 178 mg/dL — ABNORMAL HIGH (ref 70–99)
Potassium: 3.9 mEq/L (ref 3.7–5.3)
Sodium: 139 mEq/L (ref 137–147)
TOTAL PROTEIN: 7.6 g/dL (ref 6.0–8.3)
Total Bilirubin: 4.9 mg/dL — ABNORMAL HIGH (ref 0.3–1.2)

## 2013-12-19 LAB — URINE MICROSCOPIC-ADD ON

## 2013-12-19 LAB — LIPASE, BLOOD: LIPASE: 27 U/L (ref 11–59)

## 2013-12-19 MED ORDER — SODIUM CHLORIDE 0.9 % IV BOLUS (SEPSIS)
1000.0000 mL | Freq: Once | INTRAVENOUS | Status: AC
  Administered 2013-12-19: 1000 mL via INTRAVENOUS

## 2013-12-19 MED ORDER — HYDROMORPHONE HCL PF 1 MG/ML IJ SOLN
1.0000 mg | Freq: Once | INTRAMUSCULAR | Status: AC
  Administered 2013-12-19: 1 mg via INTRAVENOUS
  Filled 2013-12-19: qty 1

## 2013-12-19 MED ORDER — ONDANSETRON HCL 4 MG/2ML IJ SOLN
4.0000 mg | Freq: Once | INTRAMUSCULAR | Status: AC
  Administered 2013-12-19: 4 mg via INTRAVENOUS
  Filled 2013-12-19: qty 2

## 2013-12-19 MED ORDER — IOHEXOL 300 MG/ML  SOLN
100.0000 mL | Freq: Once | INTRAMUSCULAR | Status: AC | PRN
Start: 2013-12-19 — End: 2013-12-19
  Administered 2013-12-19: 100 mL via INTRAVENOUS

## 2013-12-19 NOTE — ED Notes (Signed)
Pt reports epigastric ab pain that started yesterday. Pt states that pain is strong in quality and radiates to back. Pt denies n/v/d or fevers. Pt last BM was today and normal. Pt alert and ambulatory in triage.

## 2013-12-19 NOTE — H&P (Signed)
[facility] History and Physical  Douglas Cole XIP:[id number] DOB: 1961-02-07 DOA: 12/19/2013  Referring physician: Dr. Regenia Skeeter  PCP: Lujean Amel, MD   Chief Complaint: Abdominal pain and nausea.  HPI: Douglas Cole is a 53 y.o. male past medical history significant for migraines, GERD and history of cholecystectomy (2012); who presented to the ED complaining of excruciating abdominal pain (mid epigastric to right upper quadrant). Patient reports the pain started on an of and has been present for the last 3 days now being today the worst. He endorses that the pain increased with food intake and because of that has not been eating and drinking much throughout the day; he also endorses some nausea but no vomiting. Patient denies any melena, hematochezia, dysuria, hematemesis, fever, chills, headaches or any other abnormalities. He reports no increase the use of pain states, alcohol or any other drug with liver toxicity potential.  In the ED workup demonstrated elevated alkaline phosphatase, AST/ALT and also elevated bilirubin; physical exam with mild icterus and ongoing abdominal pain with difficulty keeping by mouth. [facility] has been called to admit the patient for further evaluation and treatment.  Review of Systems:  Negative except as otherwise mentioned on history of present illness.  History reviewed. No pertinent past medical history. Past Surgical History  Procedure Laterality Date  . Cholecystectomy  05/28/11     lap chole    Social History:  reports that he has never smoked. He does not have any smokeless tobacco history on file. He reports that he drinks alcohol. He reports that he does not use illicit drugs.  Allergies  Allergen Reactions  . Penicillins Rash    Family History  Problem Relation Age of Onset  . Cancer Mother     kidney     Prior to Admission medications   Medication Sig Start Date End Date Taking? Authorizing Provider    Aspirin-Salicylamide-Caffeine (BC FAST PAIN RELIEF) [phone] MG PACK Take 1 Package by mouth 2 (two) times daily as needed (pain).   Yes Historical Provider, MD  calcium carbonate (TUMS - DOSED IN MG ELEMENTAL CALCIUM) 500 MG chewable tablet Chew 1 tablet by mouth 2 (two) times daily as needed for indigestion or heartburn (indigestion).   Yes Historical Provider, MD  Multiple Vitamins-Minerals (MULTIVITAMIN WITH MINERALS) tablet Take 1 tablet by mouth daily.   Yes Historical Provider, MD  polyethylene glycol (MIRALAX / GLYCOLAX) packet Take 17 g by mouth daily.   Yes Historical Provider, MD   Physical Exam: Filed Vitals:   12/19/13 2159  BP: 127/75  Pulse: 122  Temp:   Resp: 19    BP 127/75  Pulse 122  Temp(Src) 99.4 F (37.4 C) (Oral)  Resp 19  SpO2 96%  General:  Appears calm after dilaudid given in ED; initially with excruciating abd pain and some nausea; no fever  Eyes: PERRL, normal lids and irises; no nystagmus, positive icterus ENT: grossly normal hearing, lips & tongue; good dentition, no erythema or exudates inside his mouth; no discharges out of his ears or nostrils  Neck: no LAD, masses or thyromegaly; no bruits, no JVD Cardiovascular: Tachycardia; no murmurs, no rubs, no gallops. No LE edema. Respiratory: CTA bilaterally, no w/r/r. Normal respiratory effort. Abdomen: soft, no guarding, no rebound. Pain to palpation in mid epigastric and right upper quadrant area. Positive bowel sounds Skin: no rash or induration seen on limited exam Musculoskeletal: grossly normal tone BUE/BLE Psychiatric: grossly normal mood and affect, speech fluent and appropriate Neurologic: grossly non-focal.  Labs on Admission:  Basic Metabolic Panel:  Recent Labs Lab 12/19/13 2030  NA 139  K 3.9  CL 99  CO2 27  GLUCOSE 178*  BUN 17  CREATININE 0.90  CALCIUM 9.6   Liver Function Tests:  Recent Labs Lab 12/19/13 2030  AST 408*  ALT 325*  ALKPHOS 142*  BILITOT  4.9*  PROT 7.6  ALBUMIN 4.2    Recent Labs Lab 12/19/13 2030  LIPASE 27   CBC:  Recent Labs Lab 12/19/13 2030  WBC 7.4  NEUTROABS 6.8  HGB 14.4  HCT 41.3  MCV 83.3  PLT 104*   Radiological Exams on Admission: Ct Abdomen Pelvis W Contrast  12/19/2013   CLINICAL DATA:  Epigastric pain, back pain.  EXAM: CT ABDOMEN AND PELVIS WITH CONTRAST  TECHNIQUE: Multidetector CT imaging of the abdomen and pelvis was performed using the standard protocol following bolus administration of intravenous contrast.  CONTRAST:  141mL OMNIPAQUE IOHEXOL 300 MG/ML  SOLN  COMPARISON:  None.  FINDINGS: Visualized lung bases clear. Coronary calcifications noted. 2 cm probable hepatic cyst in hepatic segment 4. Additional subcentimeter low-attenuation foci, sharply demarcated for size and persisting on delayed scans, possibly also small cysts but too small to accurately characterize. Pneumobilia and gas in the nondilated distal CBD suggest previous sphincterotomy. 12 mm nonspecific low-attenuation lesion in the spleen on image 20/2. No splenomegaly. Pancreas, right kidney, and adrenal glands unremarkable. Subcentimeter cyst, lower pole left kidney. No hydronephrosis. Surgical clips in the gallbladder fossa. Stomach is physiologically distended. Small bowel and colon are nondilated. Normal appendix. Urinary bladder is physiologically distended. There is moderate prostatic enlargement. No ascites. No free air. No adenopathy localized. Regional bones unremarkable.  IMPRESSION: 1. Atherosclerosis, including coronary artery disease. Please note that although the presence of coronary artery calcium documents the presence of coronary artery disease, the severity of this disease and any potential stenosis cannot be assessed on this non-gated CT examination. Assessment for potential risk factor modification, dietary therapy or pharmacologic therapy may be warranted, if clinically indicated. 2. Hepatic and splenic lesions possibly  cysts but some incompletely characterized. 3. Prostatic enlargement.   Electronically Signed   By: [name]   On: 12/19/2013 21:58   Dg Abd Acute W/chest  12/19/2013   CLINICAL DATA:  Upper abdominal pain, nausea, prior cholecystectomy  EXAM: ACUTE ABDOMEN SERIES (ABDOMEN 2 VIEW & CHEST 1 VIEW)  COMPARISON:  None.  FINDINGS: Heart size and mediastinal contours are within normal limits.  Lungs are clear. No effusion. Surgical clips in the right upper abdomen.  No free air. Small bowel decompressed. Moderate colonic fecal material. Rectum is decompressed.  There are no abnormal calcifications.  Regional bones unremarkable.  IMPRESSION: No acute cardiopulmonary disease.  Nonobstructive bowel gas pattern with moderate colonic fecal material.   Electronically Signed   By: [name]   On: 12/19/2013 21:05    EKG:  EKG ordered but pending  Assessment/Plan 1-Abdominal pain and nausea: especially right upper quadrant. Worse with food intake and with elevated transaminitis. -History of cholecystectomy and a sphincterectomy in the past -CT in the ED demonstrated pneumobilia and dilatation of common bile duct (difficult to say if secondary to prior surgery and ongoing process). Since patient is presenting icterus and has elevated liver enzymes, with upper abd pain, will check MRCP, provide IVF's and follow GI Rec's -will follow CMET in am -PRN analgesics and antiemetics will be available  2-Transaminitis: Appears to be associated with retained stone in the  patient biliary system -will check MRCP -GI has been consulted -will check hepatitis panel, but doubt to be the problem given no fever, no vomiting or risk behaviors for hepatitis.  3-Tachycardia: Most likely secondary to pain and mild dehydration. -Will provide IV fluid resuscitation -PRN analgesics -Will check TSH -Patient will be admitted to telemetry.  4-GERD (gastroesophageal reflux disease): Pepcid IV has been  ordered  5-Migraine: Stable and currently not present. When necessary Tylenol will be available if needed.  DVT: Heparin   GI (Dr. Ardis Hughs)  Code Status: Full Family Communication: wife at bedside Disposition Plan: admit to telemetry; observation; LOS < 2 midnights  Time spent: 50 minutes  [facility] Pager [phone]

## 2013-12-19 NOTE — ED Provider Notes (Signed)
CSN: [id number]     Arrival date & time 12/19/13  1856 History   First MD Initiated Contact with Patient 12/19/13 1933     Chief Complaint  Patient presents with  . Abdominal Pain     (Consider location/radiation/quality/duration/timing/severity/associated sxs/prior Treatment) HPI Douglas Cole Is a 53 year old male with a past medical history of laparoscopic cholecystectomy.  He presents the emergency department for severe abdominal pain.  The patient states that for many months he has had intermittent sharp epigastric pain which is worse after eating.  Patient states that yesterday he began having abdominal pain which was constant throughout the day today.  He came home and ate dinner and then began having severe 10 out of 10, gripping, gnawing epigastric abdominal pain that radiates to his back.  Patient denies a history of alcohol abuse or overuse of NSAIDs.  No known history of peptic ulcer disease or GERD.  Patient denies a history of arterial disease.  Patient denies nausea vomiting diarrhea or fevers.  Last bowel movement was today and normal.  He cannot find a comfortable position, nothing makes his pain worse or better.  He states this is the worst pain he ever had.  He denies chest pain or shortness of breath.  History reviewed. No pertinent past medical history. Past Surgical History  Procedure Laterality Date  . Cholecystectomy  05/28/11     lap chole    Family History  Problem Relation Age of Onset  . Cancer Mother     kidney   History  Substance Use Topics  . Smoking status: Never Smoker   . Smokeless tobacco: Not on file  . Alcohol Use: Yes    Review of Systems  Ten systems reviewed and are negative for acute change, except as noted in the HPI.    Allergies  Penicillins  Home Medications   Prior to Admission medications   Medication Sig Start Date End Date Taking? Authorizing Provider  Aspirin-Salicylamide-Caffeine (BC FAST PAIN RELIEF) [phone] MG PACK  Take 1 Package by mouth 2 (two) times daily as needed (pain).   Yes Historical Provider, MD  calcium carbonate (TUMS - DOSED IN MG ELEMENTAL CALCIUM) 500 MG chewable tablet Chew 1 tablet by mouth 2 (two) times daily as needed for indigestion or heartburn (indigestion).   Yes Historical Provider, MD  Multiple Vitamins-Minerals (MULTIVITAMIN WITH MINERALS) tablet Take 1 tablet by mouth daily.   Yes Historical Provider, MD  polyethylene glycol (MIRALAX / GLYCOLAX) packet Take 17 g by mouth daily.   Yes Historical Provider, MD   BP 123/67  Pulse 115  Temp(Src) 99.4 F (37.4 C) (Oral)  Resp 16  SpO2 98% Physical Exam  ED Course  Procedures (including critical care time) Labs Review Labs Reviewed  CBC WITH DIFFERENTIAL  COMPREHENSIVE METABOLIC PANEL  LIPASE, BLOOD  URINALYSIS, ROUTINE W REFLEX MICROSCOPIC    Imaging Review No results found.   EKG Interpretation None      MDM   Final diagnoses:  Abdominal pain  Elevated transaminase measurement  Elevated bilirubin   8:56 PM. BP 123/67  Pulse 115  Temp(Src) 99.4 F (37.4 C) (Oral)  Resp 16  SpO2 98% Patient with Severe epigastric pain.  On exam patient's urine has been collected and is present.  It does appear dark and bloody.  Differential diagnoses include severe gastritis, peptic ulcer disease, perforated ulcer, pancreatitis.  Kidney stone is also on the differential the patient does appear to have hematuria.  Pain control and antinausea medication  given along with fluids.  Patient with marked elevation in his liver enzymes. No elevation in lipase. CT shows non-specific  Liver and spleen lesions. I have spoken to Dr. Ardis Hughs about the findings and he asks that the patient be admitted and will consult on him in the morning for upper endoscopy. Ptietn is to remain NPO after midnight. I have discussed lab findings and imaging with the patient who is in agreement with admission. Pain is well controlled at this time .  Hemodynamically stable and afebrile.   The patient appears reasonably stabilized for admission considering the current resources, flow, and capabilities available in the ED at this time, and I doubt any other [facility] requiring further screening and/or treatment in the ED prior to admission.   Margarita Mail, PA-C 12/21/13 [phone]

## 2013-12-20 ENCOUNTER — Encounter (HOSPITAL_COMMUNITY): Payer: Self-pay

## 2013-12-20 ENCOUNTER — Observation Stay (HOSPITAL_COMMUNITY): Payer: BC Managed Care – PPO

## 2013-12-20 LAB — COMPREHENSIVE METABOLIC PANEL
ALT: 340 U/L — ABNORMAL HIGH (ref 0–53)
AST: 321 U/L — ABNORMAL HIGH (ref 0–37)
Albumin: 3.2 g/dL — ABNORMAL LOW (ref 3.5–5.2)
Alkaline Phosphatase: 124 U/L — ABNORMAL HIGH (ref 39–117)
BUN: 13 mg/dL (ref 6–23)
CALCIUM: 8.7 mg/dL (ref 8.4–10.5)
CO2: 25 mEq/L (ref 19–32)
Chloride: 102 mEq/L (ref 96–112)
Creatinine, Ser: 0.85 mg/dL (ref 0.50–1.35)
GFR calc non Af Amer: >90 mL/min (ref >90)
GLUCOSE: 167 mg/dL — AB (ref 70–99)
POTASSIUM: 4.5 meq/L (ref 3.7–5.3)
Sodium: 138 mEq/L (ref 137–147)
TOTAL PROTEIN: 6.1 g/dL (ref 6.0–8.3)
Total Bilirubin: 5.2 mg/dL — ABNORMAL HIGH (ref 0.3–1.2)

## 2013-12-20 LAB — CBC
HEMATOCRIT: 36.5 % — AB (ref 39.0–52.0)
Hemoglobin: 12.6 g/dL — ABNORMAL LOW (ref 13.0–17.0)
MCH: 29.4 pg (ref 26.0–34.0)
MCHC: 34.5 g/dL (ref 30.0–36.0)
MCV: 85.1 fL (ref 78.0–100.0)
Platelets: 106 10*3/uL — ABNORMAL LOW (ref 150–400)
RBC: 4.29 MIL/uL (ref 4.22–5.81)
RDW: 13.4 % (ref 11.5–15.5)
WBC: 10 10*3/uL (ref 4.0–10.5)

## 2013-12-20 LAB — BILIRUBIN, FRACTIONATED(TOT/DIR/INDIR)
BILIRUBIN DIRECT: 5 mg/dL — AB (ref 0.0–0.3)
BILIRUBIN INDIRECT: 2.1 mg/dL — AB (ref 0.3–0.9)
BILIRUBIN TOTAL: 7.1 mg/dL — AB (ref 0.3–1.2)

## 2013-12-20 LAB — FERRITIN: FERRITIN: 388 ng/mL — AB (ref 22–322)

## 2013-12-20 LAB — TSH: TSH: 0.823 u[IU]/mL (ref 0.350–4.500)

## 2013-12-20 MED ORDER — ACETAMINOPHEN 650 MG RE SUPP: 650.0000 mg | Freq: Four times a day (QID) | RECTAL | Status: DC | PRN

## 2013-12-20 MED ORDER — SODIUM CHLORIDE 0.9 % IV SOLN
40.0000 mg | Freq: Every day | INTRAVENOUS | Status: DC
  Administered 2013-12-20: 40 mg via INTRAVENOUS
  Filled 2013-12-20 (×2): qty 4

## 2013-12-20 MED ORDER — PANTOPRAZOLE SODIUM 40 MG IV SOLR
40.0000 mg | INTRAVENOUS | Status: DC
  Administered 2013-12-20 – 2013-12-21 (×2): 40 mg via INTRAVENOUS
  Filled 2013-12-20 (×3): qty 40

## 2013-12-20 MED ORDER — MORPHINE SULFATE 2 MG/ML IJ SOLN: 2.0000 mg | INTRAMUSCULAR | Status: DC | PRN

## 2013-12-20 MED ORDER — SODIUM CHLORIDE 0.9 % IJ SOLN: 3.0000 mL | Freq: Two times a day (BID) | INTRAMUSCULAR | Status: DC

## 2013-12-20 MED ORDER — SODIUM CHLORIDE 0.9 % IV SOLN
INTRAVENOUS | Status: DC
  Administered 2013-12-20 – 2013-12-22 (×2): via INTRAVENOUS

## 2013-12-20 MED ORDER — SODIUM CHLORIDE 0.9 % IV SOLN
INTRAVENOUS | Status: DC
  Administered 2013-12-20: 16:00:00 via INTRAVENOUS

## 2013-12-20 MED ORDER — ONDANSETRON HCL 4 MG PO TABS: 4.0000 mg | ORAL_TABLET | Freq: Four times a day (QID) | ORAL | Status: DC | PRN

## 2013-12-20 MED ORDER — ONDANSETRON HCL 4 MG/2ML IJ SOLN: 4.0000 mg | Freq: Four times a day (QID) | INTRAMUSCULAR | Status: DC | PRN

## 2013-12-20 MED ORDER — GADOBENATE DIMEGLUMINE 529 MG/ML IV SOLN
20.0000 mL | Freq: Once | INTRAVENOUS | Status: AC | PRN
  Administered 2013-12-20: 16 mL via INTRAVENOUS

## 2013-12-20 MED ORDER — ACETAMINOPHEN 325 MG PO TABS
650.0000 mg | ORAL_TABLET | Freq: Four times a day (QID) | ORAL | Status: DC | PRN
  Administered 2013-12-20 – 2013-12-21 (×2): 650 mg via ORAL
  Filled 2013-12-20 (×2): qty 2

## 2013-12-20 MED ORDER — HEPARIN SODIUM (PORCINE) 5000 UNIT/ML IJ SOLN
5000.0000 [IU] | Freq: Three times a day (TID) | INTRAMUSCULAR | Status: DC
  Filled 2013-12-20 (×4): qty 1

## 2013-12-20 NOTE — Progress Notes (Signed)
[facility] PROGRESS NOTE  Douglas Cole JSE:[id number] DOB: 11/24/1960 DOA: 12/19/2013 PCP: Lujean Amel, MD  Assessment/Plan: 1. Abd pain/abnormal LFTs -s/p cholecystectomy and sphincterotomy 9/12 -possibility of retained CBD stones, lipase normal -drinks ETOH small amounts on weekends only -for MRCP today -[facility] consulted, D/w Dr.Edwards -start clears  2. GERD -add PPI  DVT proph: SCDs, due to low plts Code Status: Full Code Family Communication: d/w wife at bedside Disposition Plan: Home when improved   Consultants:  [facility]  HPI/Subjective: abd pain improved  Objective: Filed Vitals:   12/20/13 0546  BP: 113/74  Pulse: 77  Temp: 98.2 F (36.8 C)  Resp: 20    Intake/Output Summary (Last 24 hours) at 12/20/13 1312 Last data filed at 12/20/13 0600  Gross per 24 hour  Intake 410.25 ml  Output    400 ml  Net  10.25 ml   Filed Weights   12/20/13 0054  Weight: 76.839 kg (169 lb 6.4 oz)    Exam:   General:  AAOx3,   Cardiovascular: S1S2/RRR  Respiratory: CTAB  Abdomen: soft, NT, ND, BS present  Musculoskeletal: no edema c/c   Data Reviewed: Basic Metabolic Panel:  Recent Labs Lab 12/19/13 2030 12/20/13 0350  NA 139 138  K 3.9 4.5  CL 99 102  CO2 27 25  GLUCOSE 178* 167*  BUN 17 13  CREATININE 0.90 0.85  CALCIUM 9.6 8.7   Liver Function Tests:  Recent Labs Lab 12/19/13 2030 12/20/13 0350  AST 408* 321*  ALT 325* 340*  ALKPHOS 142* 124*  BILITOT 4.9* 5.2*  PROT 7.6 6.1  ALBUMIN 4.2 3.2*    Recent Labs Lab 12/19/13 2030  LIPASE 27   No results found for this basename: AMMONIA,  in the last 168 hours CBC:  Recent Labs Lab 12/19/13 2030 12/20/13 0350  WBC 7.4 10.0  NEUTROABS 6.8  --   HGB 14.4 12.6*  HCT 41.3 36.5*  MCV 83.3 85.1  PLT 104* 106*   Cardiac Enzymes: No results found for this basename: CKTOTAL, CKMB, CKMBINDEX, TROPONINI,  in the last 168 hours BNP (last 3 results) No results found for  this basename: PROBNP,  in the last 8760 hours CBG: No results found for this basename: GLUCAP,  in the last 168 hours  No results found for this or any previous visit (from the past 240 hour(s)).   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  EXAM: ORBITS FOR FOREIGN BODY - 2 VIEW  COMPARISON:  None.  FINDINGS: There is no evidence of metallic foreign body within the orbits. No significant bone abnormality identified.  IMPRESSION: No evidence of metallic foreign body within the orbits.   Electronically Signed   By: [name]   On: 12/20/2013 11:19   Ct Abdomen Pelvis W Contrast  12/19/2013   CLINICAL DATA:  Epigastric pain, back pain.  EXAM: CT ABDOMEN AND PELVIS WITH CONTRAST  TECHNIQUE: Multidetector CT imaging of the abdomen and pelvis was performed using the standard protocol following bolus administration of intravenous contrast.  CONTRAST:  179mL OMNIPAQUE IOHEXOL 300 MG/ML  SOLN  COMPARISON:  None.  FINDINGS: Visualized lung bases clear. Coronary calcifications noted. 2 cm probable hepatic cyst in hepatic segment 4. Additional subcentimeter low-attenuation foci, sharply demarcated for size and persisting on delayed scans, possibly also small cysts but too small to accurately  characterize. Pneumobilia and gas in the nondilated distal CBD suggest previous sphincterotomy. 12 mm nonspecific low-attenuation lesion in the spleen on image 20/2. No splenomegaly. Pancreas, right kidney, and adrenal glands unremarkable. Subcentimeter cyst, lower pole left kidney. No hydronephrosis. Surgical clips in the gallbladder fossa. Stomach is physiologically distended. Small bowel and colon are nondilated. Normal appendix. Urinary bladder is physiologically distended. There is moderate prostatic enlargement. No ascites. No free air. No adenopathy localized. Regional bones unremarkable.   IMPRESSION: 1. Atherosclerosis, including coronary artery disease. Please note that although the presence of coronary artery calcium documents the presence of coronary artery disease, the severity of this disease and any potential stenosis cannot be assessed on this non-gated CT examination. Assessment for potential risk factor modification, dietary therapy or pharmacologic therapy may be warranted, if clinically indicated. 2. Hepatic and splenic lesions possibly cysts but some incompletely characterized. 3. Prostatic enlargement.   Electronically Signed   By: [name]   On: 12/19/2013 21:58   Dg Abd Acute W/chest  12/19/2013   CLINICAL DATA:  Upper abdominal pain, nausea, prior cholecystectomy  EXAM: ACUTE ABDOMEN SERIES (ABDOMEN 2 VIEW & CHEST 1 VIEW)  COMPARISON:  None.  FINDINGS: Heart size and mediastinal contours are within normal limits.  Lungs are clear. No effusion. Surgical clips in the right upper abdomen.  No free air. Small bowel decompressed. Moderate colonic fecal material. Rectum is decompressed.  There are no abnormal calcifications.  Regional bones unremarkable.  IMPRESSION: No acute cardiopulmonary disease.  Nonobstructive bowel gas pattern with moderate colonic fecal material.   Electronically Signed   By: [name]   On: 12/19/2013 21:05    Scheduled Meds: . famotidine (PEPCID) IV  40 mg Intravenous QHS  . sodium chloride  3 mL Intravenous Q12H   Continuous Infusions: . sodium chloride 75 mL/hr at 12/20/13 0115   Antibiotics Given (last 72 hours)   None      Principal Problem:   Abdominal pain Active Problems:   Transaminitis   Tachycardia   GERD (gastroesophageal reflux disease)   Migraine    Time spent: 32min    [place] [facility] Pager [phone]. If 7PM-7AM, please contact night-coverage at [email or web address], password [facility] 12/20/2013, 1:12 PM  LOS: 1 day

## 2013-12-20 NOTE — Progress Notes (Signed)
Called hospitalist this AM to ask about heparin for VTE prophylaxis. Pt has elevated liver enzymes and low platelet count. Pt is ambulatory. MD gave verbal order to d/c heparin and order SCDs. SCDs ordered from portable, but have not arrived. Will tell next shift RN.

## 2013-12-20 NOTE — Consult Note (Signed)
[facility] CONSULT Reason for consult: Abnormal Liver Tests Referring Physician: Triad Cole. PCP: Douglas. Dorthy Cole. Primary gastroenterologist: Douglas Cole is an 53 y.o. male.  HPI: he has a history of cholecystectomy 2012. This was complicated by CBD stone. Douglas Douglas Cole performed ERCP with [facility]. This was done 2012 and according to the note a 1.2 cm: was pulled through the sphincterotomy site of the duck was swept with several stones removed. Patient reports via one episode a similar pain that lasts about an hour roughly 6 to 7 months after this procedure and then did well until several months ago. He has had reflux symptoms. For the past month or so he has had vague epigastric pain in for 2 to 3 weeks it has been severe. He is not vomited. He stated the panel is starting after eating and has gotten worse over the past several weeks. The patient has taken TUMS for reflux and some type of aspirin salicylate caffeine BC preparation for abdominal pain. In addition to this for about 4 to 6 weeks he has been taking a male supplement that contains apparently 7 essential vitamins and supplements that he is obtained from the health food store. He is unable to tell me exactly what is in it. He presented to the emergency room with the symptoms and found to be jaundice with bilirubin 5, transaminases 3 to 400 and alk phos only minimally elevated it 142. CT scan did show atherosclerosis of the abdominal vessels as well as coronary disease and subsists in the liver and enlarged prostate. Of note there was an error in the bile duct in the CBD was not dilated. The patient has continued to have this epigastric pain that he states was somewhat similar to his gallbladder pain.  History reviewed. No pertinent past medical history.  Past Surgical History  Procedure Laterality Date  . Cholecystectomy  05/28/11     lap chole     Family History  Problem Relation Age of Onset  . Cancer Mother     kidney     Social History:  reports that he has never smoked. He does not have any smokeless tobacco history on file. He reports that he drinks alcohol. He reports that he does not use illicit drugs.  Allergies:  Allergies  Allergen Reactions  . Penicillins Rash    Medications; Prior to Admission medications   Medication Sig Start Date End Date Taking? Authorizing Provider  Aspirin-Salicylamide-Caffeine (BC FAST PAIN RELIEF) [phone] MG PACK Take 1 Package by mouth 2 (two) times daily as needed (pain).   Yes Historical Provider, MD  calcium carbonate (TUMS - DOSED IN MG ELEMENTAL CALCIUM) 500 MG chewable tablet Chew 1 tablet by mouth 2 (two) times daily as needed for indigestion or heartburn (indigestion).   Yes Historical Provider, MD  Multiple Vitamins-Minerals (MULTIVITAMIN WITH MINERALS) tablet Take 1 tablet by mouth daily.   Yes Historical Provider, MD  polyethylene glycol (MIRALAX / GLYCOLAX) packet Take 17 g by mouth daily.   Yes Historical Provider, MD   . pantoprazole (PROTONIX) IV  40 mg Intravenous Q24H  . sodium chloride  3 mL Intravenous Q12H   PRN Meds acetaminophen, acetaminophen, morphine injection, ondansetron (ZOFRAN) IV, ondansetron Results for orders placed during the hospital encounter of 12/19/13 (from the past 48 hour(s))  CBC WITH DIFFERENTIAL     Status: Abnormal   Collection Time    12/19/13  8:30 PM      Result Value Ref Range  WBC 7.4  4.0 - 10.5 K/uL   RBC 4.96  4.22 - 5.81 MIL/uL   Hemoglobin 14.4  13.0 - 17.0 g/dL   HCT 70.9  48.4 - 14.5 %   MCV 83.3  78.0 - 100.0 fL   MCH 29.0  26.0 - 34.0 pg   MCHC 34.9  30.0 - 36.0 g/dL   RDW 65.4  72.8 - 25.0 %   Platelets 104 (*) 150 - 400 K/uL   Comment: SPECIMEN CHECKED FOR CLOTS     PLATELET COUNT CONFIRMED BY SMEAR   Neutrophils Relative % 92 (*) 43 - 77 %   Lymphocytes Relative 5 (*) 12 - 46 %   Monocytes Relative 3  3 - 12 %   Eosinophils Relative 0  0 - 5 %   Basophils Relative 0  0 - 1 %   Neutro  Abs 6.8  1.7 - 7.7 K/uL   Lymphs Abs 0.4 (*) 0.7 - 4.0 K/uL   Monocytes Absolute 0.2  0.1 - 1.0 K/uL   Eosinophils Absolute 0.0  0.0 - 0.7 K/uL   Basophils Absolute 0.0  0.0 - 0.1 K/uL   Smear Review LARGE PLATELETS PRESENT    COMPREHENSIVE METABOLIC PANEL     Status: Abnormal   Collection Time    12/19/13  8:30 PM      Result Value Ref Range   Sodium 139  137 - 147 mEq/L   Potassium 3.9  3.7 - 5.3 mEq/L   Chloride 99  96 - 112 mEq/L   CO2 27  19 - 32 mEq/L   Glucose, Bld 178 (*) 70 - 99 mg/dL   BUN 17  6 - 23 mg/dL   Creatinine, Ser 3.11  0.50 - 1.35 mg/dL   Calcium 9.6  8.4 - 74.3 mg/dL   Total Protein 7.6  6.0 - 8.3 g/dL   Albumin 4.2  3.5 - 5.2 g/dL   AST 436 (*) 0 - 37 U/L   ALT 325 (*) 0 - 53 U/L   Alkaline Phosphatase 142 (*) 39 - 117 U/L   Total Bilirubin 4.9 (*) 0.3 - 1.2 mg/dL   GFR calc non Af Amer >90  >90 mL/min   GFR calc Af Amer >90  >90 mL/min   Comment: (NOTE)     The eGFR has been calculated using the CKD EPI equation.     This calculation has not been validated in all clinical situations.     eGFR's persistently <90 mL/min signify possible Chronic Kidney     Disease.  LIPASE, BLOOD     Status: None   Collection Time    12/19/13  8:30 PM      Result Value Ref Range   Lipase 27  11 - 59 U/L  URINALYSIS, ROUTINE W REFLEX MICROSCOPIC     Status: Abnormal   Collection Time    12/19/13  8:32 PM      Result Value Ref Range   Color, Urine ORANGE (*) YELLOW   Comment: BIOCHEMICALS MAY BE AFFECTED BY COLOR   APPearance CLEAR  CLEAR   Specific Gravity, Urine 1.027  1.005 - 1.030   pH 6.0  5.0 - 8.0   Glucose, UA NEGATIVE  NEGATIVE mg/dL   Hgb urine dipstick NEGATIVE  NEGATIVE   Bilirubin Urine MODERATE (*) NEGATIVE   Ketones, ur NEGATIVE  NEGATIVE mg/dL   Protein, ur 30 (*) NEGATIVE mg/dL   Urobilinogen, UA 4.0 (*) 0.0 - 1.0 mg/dL   Nitrite NEGATIVE  NEGATIVE   Leukocytes, UA SMALL (*) NEGATIVE  URINE MICROSCOPIC-ADD ON     Status: None   Collection Time     12/19/13  8:32 PM      Result Value Ref Range   Squamous Epithelial / LPF RARE  RARE   WBC, UA 0-2  <3 WBC/hpf   Bacteria, UA RARE  RARE  TSH     Status: None   Collection Time    12/20/13  3:50 AM      Result Value Ref Range   TSH 0.823  0.350 - 4.500 uIU/mL   Comment: Please note change in reference range.     Performed at [facility] PANEL     Status: Abnormal   Collection Time    12/20/13  3:50 AM      Result Value Ref Range   Sodium 138  137 - 147 mEq/L   Potassium 4.5  3.7 - 5.3 mEq/L   Chloride 102  96 - 112 mEq/L   CO2 25  19 - 32 mEq/L   Glucose, Bld 167 (*) 70 - 99 mg/dL   BUN 13  6 - 23 mg/dL   Creatinine, Ser 0.85  0.50 - 1.35 mg/dL   Calcium 8.7  8.4 - 10.5 mg/dL   Total Protein 6.1  6.0 - 8.3 g/dL   Albumin 3.2 (*) 3.5 - 5.2 g/dL   AST 321 (*) 0 - 37 U/L   ALT 340 (*) 0 - 53 U/L   Alkaline Phosphatase 124 (*) 39 - 117 U/L   Total Bilirubin 5.2 (*) 0.3 - 1.2 mg/dL   GFR calc non Af Amer >90  >90 mL/min   GFR calc Af Amer >90  >90 mL/min   Comment: (NOTE)     The eGFR has been calculated using the CKD EPI equation.     This calculation has not been validated in all clinical situations.     eGFR's persistently <90 mL/min signify possible Chronic Kidney     Disease.  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 The patient recalls having metal removed from the right eye.  EXAM: ORBITS FOR FOREIGN BODY - 2 VIEW  COMPARISON:  None.  FINDINGS: There is no evidence of  metallic foreign body within the orbits. No significant bone abnormality identified.  IMPRESSION: No evidence of metallic foreign body within the orbits.   Electronically Signed   By: [name]   On: 12/20/2013 11:19   Ct Abdomen Pelvis W Contrast  12/19/2013   CLINICAL DATA:  Epigastric pain, back pain.  EXAM: CT ABDOMEN AND PELVIS WITH CONTRAST  TECHNIQUE: Multidetector CT imaging of the abdomen and pelvis was performed using the standard protocol following  bolus administration of intravenous contrast.  CONTRAST:  115mL OMNIPAQUE IOHEXOL 300 MG/ML  SOLN  COMPARISON:  None.  FINDINGS: Visualized lung bases clear. Coronary calcifications noted. 2 cm probable hepatic cyst in hepatic segment 4. Additional subcentimeter low-attenuation foci, sharply demarcated for size and persisting on delayed scans, possibly also small cysts but too small to accurately characterize. Pneumobilia and gas in the nondilated distal CBD suggest previous sphincterotomy. 12 mm nonspecific low-attenuation lesion in the spleen on image 20/2. No splenomegaly. Pancreas, right kidney, and adrenal glands unremarkable. Subcentimeter cyst, lower pole left kidney. No hydronephrosis. Surgical clips in the gallbladder fossa. Stomach is physiologically distended. Small bowel and colon are nondilated. Normal appendix. Urinary bladder is physiologically distended. There is moderate prostatic enlargement. No ascites. No free air. No adenopathy localized. Regional bones unremarkable.  IMPRESSION: 1. Atherosclerosis, including coronary artery disease. Please note that although the presence of coronary artery calcium documents the presence of coronary artery disease, the severity of this disease and any potential stenosis cannot be assessed on this non-gated CT examination. Assessment for potential risk factor modification, dietary therapy or pharmacologic therapy may be warranted, if clinically indicated. 2. Hepatic and splenic lesions possibly  cysts but some incompletely characterized. 3. Prostatic enlargement.   Electronically Signed   By: [name]   On: 12/19/2013 21:58   Mr 3d Recon At Scanner  12/20/2013   CLINICAL DATA:  Abdominal pain. Elevated bilirubin. Evaluate for potential retained gallstones after cholecystectomy.  EXAM: MRI ABDOMEN WITHOUT AND WITH CONTRAST (INCLUDING MRCP)  TECHNIQUE: Multiplanar multisequence MR imaging of the abdomen was performed both before and after the administration of intravenous contrast. Heavily T2-weighted images of the biliary and pancreatic ducts were obtained, and three-dimensional MRCP images were rendered by post processing.  CONTRAST:  74mL MULTIHANCE GADOBENATE DIMEGLUMINE 529 MG/ML IV SOLN  COMPARISON:  CT the abdomen and pelvis 12/19/2013.  FINDINGS: Status post cholecystectomy. MRCP images demonstrate no intra or extrahepatic biliary ductal dilatation. No pancreatic ductal dilatation. No filling defects within the common bile duct to suggest retained ductal stones. Common bile duct measures up to 7 mm in the porta hepatis. Numerous small hepatic lesions are low signal intensity on T1 weighted images, high signal intensity on T2 weighted images, and do not enhance, compatible with small cysts or biliary hamartomas. The largest of these lesions is in segment 4A measuring up to 1.6 x 1.2 cm, with an adjacent smaller lesion immediately abutting it. The appearance of the pancreas is unremarkable.  The appearance of the spleen, bilateral adrenal glands and right kidney is unremarkable. In the lower pole of the left kidney there is a sub cm lesion that is low T1 signal, high T2 signal and does not enhance, compatible with a small simple renal cyst.  IMPRESSION: 1. No retained stones in the common bile duct. No intra or extrahepatic biliary ductal dilatation to suggest biliary tract obstruction at this time. 2. Status post cholecystectomy. 3. Numerous small hepatic cysts or biliary hamartomas  incidentally noted. 4. Tiny simple cyst in the lower pole of the left kidney.   Electronically Signed   By: Vinnie Langton M.D.   On: 12/20/2013 13:40   Dg Abd Acute W/chest  12/19/2013   CLINICAL DATA:  Upper abdominal pain, nausea, prior cholecystectomy  EXAM: ACUTE ABDOMEN SERIES (ABDOMEN 2 VIEW & CHEST 1 VIEW)  COMPARISON:  None.  FINDINGS: Heart size and mediastinal contours are within normal limits.  Lungs are clear. No effusion. Surgical clips in the  right upper abdomen.  No free air. Small bowel decompressed. Moderate colonic fecal material. Rectum is decompressed.  There are no abnormal calcifications.  Regional bones unremarkable.  IMPRESSION: No acute cardiopulmonary disease.  Nonobstructive bowel gas pattern with moderate colonic fecal material.   Electronically Signed   By: [name]   On: 12/19/2013 21:05   Mr Abd W/wo Cm/mrcp  12/20/2013   CLINICAL DATA:  Abdominal pain. Elevated bilirubin. Evaluate for potential retained gallstones after cholecystectomy.  EXAM: MRI ABDOMEN WITHOUT AND WITH CONTRAST (INCLUDING MRCP)  TECHNIQUE: Multiplanar multisequence MR imaging of the abdomen was performed both before and after the administration of intravenous contrast. Heavily T2-weighted images of the biliary and pancreatic ducts were obtained, and three-dimensional MRCP images were rendered by post processing.  CONTRAST:  4mL MULTIHANCE GADOBENATE DIMEGLUMINE 529 MG/ML IV SOLN  COMPARISON:  CT the abdomen and pelvis 12/19/2013.  FINDINGS: Status post cholecystectomy. MRCP images demonstrate no intra or extrahepatic biliary ductal dilatation. No pancreatic ductal dilatation. No filling defects within the common bile duct to suggest retained ductal stones. Common bile duct measures up to 7 mm in the porta hepatis. Numerous small hepatic lesions are low signal intensity on T1 weighted images, high signal intensity on T2 weighted images, and do not enhance, compatible with small cysts or biliary  hamartomas. The largest of these lesions is in segment 4A measuring up to 1.6 x 1.2 cm, with an adjacent smaller lesion immediately abutting it. The appearance of the pancreas is unremarkable.  The appearance of the spleen, bilateral adrenal glands and right kidney is unremarkable. In the lower pole of the left kidney there is a sub cm lesion that is low T1 signal, high T2 signal and does not enhance, compatible with a small simple renal cyst.  IMPRESSION: 1. No retained stones in the common bile duct. No intra or extrahepatic biliary ductal dilatation to suggest biliary tract obstruction at this time. 2. Status post cholecystectomy. 3. Numerous small hepatic cysts or biliary hamartomas incidentally noted. 4. Tiny simple cyst in the lower pole of the left kidney.   Electronically Signed   By: Vinnie Langton M.D.   On: 12/20/2013 13:40               Blood pressure 113/74, pulse 77, temperature 98.2 F (36.8 C), temperature source Oral, resp. rate 20, height $RemoveBe'5\' 7"'[id number]$  (1.702 m), weight 76.839 kg (169 lb 6.4 oz), SpO2 98.00%.  Physical exam:   General-- healthy appearing middle-aged white male no distress. Sclera slightly icteric Heart-- regular rate and rhythm without murmurs are gallops Lungs--clear Abdomen-- none distended, bowel sounds are present. Mild epigastric tenderness.   Assessment: 1. Abnormal liver tests. The CT and MRCP did not show any sign of dilated ducts or retain CBD stone. The patient has been taking some type of supplements from the health food store does appear to have a inflammatory pattern rather than cholecystectomy pattern. 2. Epigastric abdominal pain. Patient has been on NSAIDs for pain could have ulcer or some other cause of this pain. Still possible there could be something in his CBD is less likely 3. Status post cholecystectomy with ERCP/ERS 2012  Plan: 1. We will plan EGD in the morning. Have discussed this with the patient is agreeable. 2. We'll obtain labs  to look for other causes of elevated liver tests. And explained to him that this could have been the result of the supplements that he was taking from the health food store.   Douglas Cole  Douglas Cole. 12/20/2013, 2:37 PM

## 2013-12-21 ENCOUNTER — Encounter (HOSPITAL_COMMUNITY): Payer: Self-pay

## 2013-12-21 ENCOUNTER — Encounter (HOSPITAL_COMMUNITY)
Admission: EM | Disposition: A | Discharge status: Home / Self Care | Payer: Self-pay | Source: Home / Self Care | Attending: Internal Medicine

## 2013-12-21 HISTORY — PX: ESOPHAGOGASTRODUODENOSCOPY: SHX5428

## 2013-12-21 LAB — COMPREHENSIVE METABOLIC PANEL
ALBUMIN: 3.1 g/dL — AB (ref 3.5–5.2)
ALT: 238 U/L — ABNORMAL HIGH (ref 0–53)
AST: 125 U/L — ABNORMAL HIGH (ref 0–37)
Alkaline Phosphatase: 125 U/L — ABNORMAL HIGH (ref 39–117)
BUN: 10 mg/dL (ref 6–23)
CALCIUM: 8.6 mg/dL (ref 8.4–10.5)
CO2: 24 mEq/L (ref 19–32)
CREATININE: 0.82 mg/dL (ref 0.50–1.35)
Chloride: 101 mEq/L (ref 96–112)
GFR calc Af Amer: >90 mL/min (ref >90)
GFR calc non Af Amer: >90 mL/min (ref >90)
GLUCOSE: 119 mg/dL — AB (ref 70–99)
Potassium: 3.9 mEq/L (ref 3.7–5.3)
Sodium: 135 mEq/L — ABNORMAL LOW (ref 137–147)
TOTAL PROTEIN: 5.9 g/dL — AB (ref 6.0–8.3)
Total Bilirubin: 7.2 mg/dL — ABNORMAL HIGH (ref 0.3–1.2)

## 2013-12-21 LAB — CBC
HEMATOCRIT: 36.3 % — AB (ref 39.0–52.0)
HEMOGLOBIN: 12.8 g/dL — AB (ref 13.0–17.0)
MCH: 29.4 pg (ref 26.0–34.0)
MCHC: 35.3 g/dL (ref 30.0–36.0)
MCV: 83.3 fL (ref 78.0–100.0)
Platelets: 96 10*3/uL — ABNORMAL LOW (ref 150–400)
RBC: 4.36 MIL/uL (ref 4.22–5.81)
RDW: 13.3 % (ref 11.5–15.5)
WBC: 5.7 10*3/uL (ref 4.0–10.5)

## 2013-12-21 LAB — HEPATITIS PANEL, ACUTE
HCV AB: NEGATIVE
HCV Ab: NEGATIVE
HEP A IGM: NONREACTIVE
HEP B S AG: NEGATIVE
Hep A IgM: NONREACTIVE
Hep B C IgM: NONREACTIVE
Hep B C IgM: NONREACTIVE
Hepatitis B Surface Ag: NEGATIVE

## 2013-12-21 LAB — PROTIME-INR
INR: 1.24 (ref 0.00–1.49)
Prothrombin Time: 15.3 seconds — ABNORMAL HIGH (ref 11.6–15.2)

## 2013-12-21 LAB — HEMOGLOBIN A1C
Hgb A1c MFr Bld: 5.6 % (ref <5.7)
Mean Plasma Glucose: 114 mg/dL (ref <117)

## 2013-12-21 LAB — IRON AND TIBC
IRON: 18 ug/dL — AB (ref 42–135)
Saturation Ratios: 8 % — ABNORMAL LOW (ref 20–55)
TIBC: 221 ug/dL (ref 215–435)
UIBC: 203 ug/dL (ref 125–400)

## 2013-12-21 LAB — ANA: ANA: NEGATIVE

## 2013-12-21 LAB — LIPASE, BLOOD: LIPASE: 17 U/L (ref 11–59)

## 2013-12-21 SURGERY — EGD (ESOPHAGOGASTRODUODENOSCOPY)
Anesthesia: Moderate Sedation

## 2013-12-21 MED ORDER — MIDAZOLAM HCL 10 MG/2ML IJ SOLN
INTRAMUSCULAR | Status: AC
  Filled 2013-12-21: qty 4

## 2013-12-21 MED ORDER — DIPHENHYDRAMINE HCL 50 MG/ML IJ SOLN
INTRAMUSCULAR | Status: AC
  Filled 2013-12-21: qty 1

## 2013-12-21 MED ORDER — MIDAZOLAM HCL 10 MG/2ML IJ SOLN
INTRAMUSCULAR | Status: DC | PRN
  Administered 2013-12-21: 2 mg via INTRAVENOUS
  Administered 2013-12-21 (×2): 2.5 mg via INTRAVENOUS
  Administered 2013-12-21: 2 mg via INTRAVENOUS

## 2013-12-21 MED ORDER — BUTAMBEN-TETRACAINE-BENZOCAINE 2-2-14 % EX AERO
INHALATION_SPRAY | CUTANEOUS | Status: DC | PRN
  Administered 2013-12-21: 2 via TOPICAL

## 2013-12-21 MED ORDER — FENTANYL CITRATE 0.05 MG/ML IJ SOLN
INTRAMUSCULAR | Status: DC | PRN
  Administered 2013-12-21 (×3): 25 ug via INTRAVENOUS

## 2013-12-21 MED ORDER — FENTANYL CITRATE 0.05 MG/ML IJ SOLN
INTRAMUSCULAR | Status: AC
  Filled 2013-12-21: qty 4

## 2013-12-21 NOTE — H&P (View-Only) (Signed)
[facility] CON[facility] Reason for consult: Abnormal Liver Tests Referring Physician: [facility]. PCP: Dr. Dorthy Cooler. Primary gastroenterologist: [name] is an 53 y.o. male.  HPI: he has a history of cholecystectomy 2012. This was complicated by CBD stone. Dr Penelope Coop performed ERCP with E[facility]. This was done 2012 and according to the note a 1.2 cm: was pulled through the sphincterotomy site of the duck was swept with several stones removed. Patient reports via one episode a similar pain that lasts about an hour roughly 6 to 7 months after this procedure and then did well until several months ago. He has had reflux symptoms. For the past month or so he has had vague epigastric pain in for 2 to 3 weeks it has been severe. He is not vomited. He stated the panel is starting after eating and has gotten worse over the past several weeks. The patient has taken TUMS for reflux and some type of aspirin salicylate caffeine BC preparation for abdominal pain. In addition to this for about 4 to 6 weeks he has been taking a male supplement that contains apparently 7 essential vitamins and supplements that he is obtained from the health food store. He is unable to tell me exactly what is in it. He presented to the emergency room with the symptoms and found to be jaundice with bilirubin 5, transaminases 3 to 400 and alk phos only minimally elevated it 142. CT scan did show atherosclerosis of the abdominal vessels as well as coronary disease and subsists in the liver and enlarged prostate. Of note there was an error in the bile duct in the CBD was not dilated. The patient has continued to have this epigastric pain that he states was somewhat similar to his gallbladder pain.  History reviewed. No pertinent past medical history.  Past Surgical History  Procedure Laterality Date  . Cholecystectomy  05/28/11     lap chole     Family History  Problem Relation Age of Onset  . Cancer Mother     kidney     Social History:  reports that he has never smoked. He does not have any smokeless tobacco history on file. He reports that he drinks alcohol. He reports that he does not use illicit drugs.  Allergies:  Allergies  Allergen Reactions  . Penicillins Rash    Medications; Prior to Admission medications   Medication Sig Start Date End Date Taking? Authorizing Provider  Aspirin-Salicylamide-Caffeine (BC FAST PAIN RELIEF) [phone] MG PACK Take 1 Package by mouth 2 (two) times daily as needed (pain).   Yes Historical Provider, MD  calcium carbonate (TUMS - DOSED IN MG ELEMENTAL CALCIUM) 500 MG chewable tablet Chew 1 tablet by mouth 2 (two) times daily as needed for indigestion or heartburn (indigestion).   Yes Historical Provider, MD  Multiple Vitamins-Minerals (MULTIVITAMIN WITH MINERALS) tablet Take 1 tablet by mouth daily.   Yes Historical Provider, MD  polyethylene glycol (MIRALAX / GLYCOLAX) packet Take 17 g by mouth daily.   Yes Historical Provider, MD   . pantoprazole (PROTONIX) IV  40 mg Intravenous Q24H  . sodium chloride  3 mL Intravenous Q12H   PRN Meds acetaminophen, acetaminophen, morphine injection, ondansetron (ZOFRAN) IV, ondansetron Results for orders placed during the hospital encounter of 12/19/13 (from the past 48 hour(s))  CBC WITH DIFFERENTIAL     Status: Abnormal   Collection Time    12/19/13  8:30 PM      Result Value Ref Range  WBC 7.4  4.0 - 10.5 K/uL   RBC 4.96  4.22 - 5.81 MIL/uL   Hemoglobin 14.4  13.0 - 17.0 g/dL   HCT 41.3  39.0 - 52.0 %   MCV 83.3  78.0 - 100.0 fL   MCH 29.0  26.0 - 34.0 pg   MCHC 34.9  30.0 - 36.0 g/dL   RDW 13.1  11.5 - 15.5 %   Platelets 104 (*) 150 - 400 K/uL   Comment: SPECIMEN CHECKED FOR CLOTS     PLATELET COUNT CONFIRMED BY SMEAR   Neutrophils Relative % 92 (*) 43 - 77 %   Lymphocytes Relative 5 (*) 12 - 46 %   Monocytes Relative 3  3 - 12 %   Eosinophils Relative 0  0 - 5 %   Basophils Relative 0  0 - 1 %   Neutro  Abs 6.8  1.7 - 7.7 K/uL   Lymphs Abs 0.4 (*) 0.7 - 4.0 K/uL   Monocytes Absolute 0.2  0.1 - 1.0 K/uL   Eosinophils Absolute 0.0  0.0 - 0.7 K/uL   Basophils Absolute 0.0  0.0 - 0.1 K/uL   Smear Review LARGE PLATELETS PRESENT    COMPREHENSIVE METABOLIC PANEL     Status: Abnormal   Collection Time    12/19/13  8:30 PM      Result Value Ref Range   Sodium 139  137 - 147 mEq/L   Potassium 3.9  3.7 - 5.3 mEq/L   Chloride 99  96 - 112 mEq/L   CO2 27  19 - 32 mEq/L   Glucose, Bld 178 (*) 70 - 99 mg/dL   BUN 17  6 - 23 mg/dL   Creatinine, Ser 0.90  0.50 - 1.35 mg/dL   Calcium 9.6  8.4 - 10.5 mg/dL   Total Protein 7.6  6.0 - 8.3 g/dL   Albumin 4.2  3.5 - 5.2 g/dL   AST 408 (*) 0 - 37 U/L   ALT 325 (*) 0 - 53 U/L   Alkaline Phosphatase 142 (*) 39 - 117 U/L   Total Bilirubin 4.9 (*) 0.3 - 1.2 mg/dL   GFR calc non Af Amer >90  >90 mL/min   GFR calc Af Amer >90  >90 mL/min   Comment: (NOTE)     The eGFR has been calculated using the CKD EPI equation.     This calculation has not been validated in all clinical situations.     eGFR's persistently <90 mL/min signify possible Chronic Kidney     Disease.  LIPASE, BLOOD     Status: None   Collection Time    12/19/13  8:30 PM      Result Value Ref Range   Lipase 27  11 - 59 U/L  URINALYSIS, ROUTINE W REFLEX MICROSCOPIC     Status: Abnormal   Collection Time    12/19/13  8:32 PM      Result Value Ref Range   Color, Urine ORANGE (*) YELLOW   Comment: BIOCHEMICALS MAY BE AFFECTED BY COLOR   APPearance CLEAR  CLEAR   Specific Gravity, Urine 1.027  1.005 - 1.030   pH 6.0  5.0 - 8.0   Glucose, UA NEGATIVE  NEGATIVE mg/dL   Hgb urine dipstick NEGATIVE  NEGATIVE   Bilirubin Urine MODERATE (*) NEGATIVE   Ketones, ur NEGATIVE  NEGATIVE mg/dL   Protein, ur 30 (*) NEGATIVE mg/dL   Urobilinogen, UA 4.0 (*) 0.0 - 1.0 mg/dL   Nitrite NEGATIVE  NEGATIVE   Leukocytes, UA SMALL (*) NEGATIVE  URINE MICROSCOPIC-ADD ON     Status: None   Collection Time     12/19/13  8:32 PM      Result Value Ref Range   Squamous Epithelial / LPF RARE  RARE   WBC, UA 0-2  <3 WBC/hpf   Bacteria, UA RARE  RARE  TSH     Status: None   Collection Time    12/20/13  3:50 AM      Result Value Ref Range   TSH 0.823  0.350 - 4.500 uIU/mL   Comment: Please note change in reference range.     Performed at [facility]     Status: Abnormal   Collection Time    12/20/13  3:50 AM      Result Value Ref Range   Sodium 138  137 - 147 mEq/L   Potassium 4.5  3.7 - 5.3 mEq/L   Chloride 102  96 - 112 mEq/L   CO2 25  19 - 32 mEq/L   Glucose, Bld 167 (*) 70 - 99 mg/dL   BUN 13  6 - 23 mg/dL   Creatinine, Ser 0.85  0.50 - 1.35 mg/dL   Calcium 8.7  8.4 - 10.5 mg/dL   Total Protein 6.1  6.0 - 8.3 g/dL   Albumin 3.2 (*) 3.5 - 5.2 g/dL   AST 321 (*) 0 - 37 U/L   ALT 340 (*) 0 - 53 U/L   Alkaline Phosphatase 124 (*) 39 - 117 U/L   Total Bilirubin 5.2 (*) 0.3 - 1.2 mg/dL   GFR calc non Af Amer >90  >90 mL/min   GFR calc Af Amer >90  >90 mL/min   Comment: (NOTE)     The eGFR has been calculated using the CKD EPI equation.     This calculation has not been validated in all clinical situations.     eGFR's persistently <90 mL/min signify possible Chronic Kidney     Disease.  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 The patient recalls having metal removed from the right eye.  EXAM: ORBITS FOR FOREIGN BODY - 2 VIEW  COMPARISON:  None.  FINDINGS: There is no evidence of  metallic foreign body within the orbits. No significant bone abnormality identified.  IMPRESSION: No evidence of metallic foreign body within the orbits.   Electronically Signed   By: [name]   On: 12/20/2013 11:19   Ct Abdomen Pelvis W Contrast  12/19/2013   CLINICAL DATA:  Epigastric pain, back pain.  EXAM: CT ABDOMEN AND PELVIS WITH CONTRAST  TECHNIQUE: Multidetector CT imaging of the abdomen and pelvis was performed using the standard protocol following  bolus administration of intravenous contrast.  CONTRAST:  OMNIPAQUE IOHEXOL 300 MG/ML  SOLN  COMPARISON:  None.  FINDINGS: Visualized lung bases clear. Coronary calcifications noted. 2 cm probable hepatic cyst in hepatic segment 4. Additional subcentimeter low-attenuation foci, sharply demarcated for size and persisting on delayed scans, possibly also small cysts but too small to accurately characterize. Pneumobilia and gas in the nondilated distal CBD suggest previous sphincterotomy. 12 mm nonspecific low-attenuation lesion in the spleen on image 20/2. No splenomegaly. Pancreas, right kidney, and adrenal glands unremarkable. Subcentimeter cyst, lower pole left kidney. No hydronephrosis. Surgical clips in the gallbladder fossa. Stomach is physiologically distended. Small bowel and colon are nondilated. Normal appendix. Urinary bladder is physiologically distended. There is moderate prostatic enlargement. No ascites. No free air. No adenopathy localized. Regional bones unremarkable.  IMPRESSION: 1. Atherosclerosis, including coronary artery disease. Please note that although the presence of coronary artery calcium documents the presence of coronary artery disease, the severity of this disease and any potential stenosis cannot be assessed on this non-gated CT examination. Assessment for potential risk factor modification, dietary therapy or pharmacologic therapy may be warranted, if clinically indicated. 2. Hepatic and splenic lesions possibly  cysts but some incompletely characterized. 3. Prostatic enlargement.   Electronically Signed   By: Oley Balm M.D.   On: 12/19/2013 21:58   Mr 3d Recon At Scanner  12/20/2013   CLINICAL DATA:  Abdominal pain. Elevated bilirubin. Evaluate for potential retained gallstones after cholecystectomy.  EXAM: MRI ABDOMEN WITHOUT AND WITH CONTRAST (INCLUDING MRCP)  TECHNIQUE: Multiplanar multisequence MR imaging of the abdomen was performed both before and after the administration of intravenous contrast. Heavily T2-weighted images of the biliary and pancreatic ducts were obtained, and three-dimensional MRCP images were rendered by post processing.  CONTRAST:  38mL MULTIHANCE GADOBENATE DIMEGLUMINE 529 MG/ML IV SOLN  COMPARISON:  CT the abdomen and pelvis 12/19/2013.  FINDINGS: Status post cholecystectomy. MRCP images demonstrate no intra or extrahepatic biliary ductal dilatation. No pancreatic ductal dilatation. No filling defects within the common bile duct to suggest retained ductal stones. Common bile duct measures up to 7 mm in the porta hepatis. Numerous small hepatic lesions are low signal intensity on T1 weighted images, high signal intensity on T2 weighted images, and do not enhance, compatible with small cysts or biliary hamartomas. The largest of these lesions is in segment 4A measuring up to 1.6 x 1.2 cm, with an adjacent smaller lesion immediately abutting it. The appearance of the pancreas is unremarkable.  The appearance of the spleen, bilateral adrenal glands and right kidney is unremarkable. In the lower pole of the left kidney there is a sub cm lesion that is low T1 signal, high T2 signal and does not enhance, compatible with a small simple renal cyst.  IMPRESSION: 1. No retained stones in the common bile duct. No intra or extrahepatic biliary ductal dilatation to suggest biliary tract obstruction at this time. 2. Status post cholecystectomy. 3. Numerous small hepatic cysts or biliary hamartomas  incidentally noted. 4. Tiny simple cyst in the lower pole of the left kidney.   Electronically Signed   By: Trudie Reed M.D.   On: 12/20/2013 13:40   Dg Abd Acute W/chest  12/19/2013   CLINICAL DATA:  Upper abdominal pain, nausea, prior cholecystectomy  EXAM: ACUTE ABDOMEN SERIES (ABDOMEN 2 VIEW & CHEST 1 VIEW)  COMPARISON:  None.  FINDINGS: Heart size and mediastinal contours are within normal limits.  Lungs are clear. No effusion. Surgical clips in the  right upper abdomen.  No free air. Small bowel decompressed. Moderate colonic fecal material. Rectum is decompressed.  There are no abnormal calcifications.  Regional bones unremarkable.  IMPRESSION: No acute cardiopulmonary disease.  Nonobstructive bowel gas pattern with moderate colonic fecal material.   Electronically Signed   By: [name]   On: 12/19/2013 21:05   Mr Abd W/wo Cm/mrcp  12/20/2013   CLINICAL DATA:  Abdominal pain. Elevated bilirubin. Evaluate for potential retained gallstones after cholecystectomy.  EXAM: MRI ABDOMEN WITHOUT AND WITH CONTRAST (INCLUDING MRCP)  TECHNIQUE: Multiplanar multisequence MR imaging of the abdomen was performed both before and after the administration of intravenous contrast. Heavily T2-weighted images of the biliary and pancreatic ducts were obtained, and three-dimensional MRCP images were rendered by post processing.  CONTRAST:  65mL MULTIHANCE GADOBENATE DIMEGLUMINE 529 MG/ML IV SOLN  COMPARISON:  CT the abdomen and pelvis 12/19/2013.  FINDINGS: Status post cholecystectomy. MRCP images demonstrate no intra or extrahepatic biliary ductal dilatation. No pancreatic ductal dilatation. No filling defects within the common bile duct to suggest retained ductal stones. Common bile duct measures up to 7 mm in the porta hepatis. Numerous small hepatic lesions are low signal intensity on T1 weighted images, high signal intensity on T2 weighted images, and do not enhance, compatible with small cysts or biliary  hamartomas. The largest of these lesions is in segment 4A measuring up to 1.6 x 1.2 cm, with an adjacent smaller lesion immediately abutting it. The appearance of the pancreas is unremarkable.  The appearance of the spleen, bilateral adrenal glands and right kidney is unremarkable. In the lower pole of the left kidney there is a sub cm lesion that is low T1 signal, high T2 signal and does not enhance, compatible with a small simple renal cyst.  IMPRESSION: 1. No retained stones in the common bile duct. No intra or extrahepatic biliary ductal dilatation to suggest biliary tract obstruction at this time. 2. Status post cholecystectomy. 3. Numerous small hepatic cysts or biliary hamartomas incidentally noted. 4. Tiny simple cyst in the lower pole of the left kidney.   Electronically Signed   By: Vinnie Langton M.D.   On: 12/20/2013 13:40               Blood pressure 113/74, pulse 77, temperature 98.2 F (36.8 C), temperature source Oral, resp. rate 20, height $RemoveBe'5\' 7"'IsokaZBic$  (1.702 m), weight 76.839 kg (169 lb 6.4 oz), SpO2 98.00%.  Physical exam:   General-- healthy appearing middle-aged white male no distress. Sclera slightly icteric Heart-- regular rate and rhythm without murmurs are gallops Lungs--clear Abdomen-- none distended, bowel sounds are present. Mild epigastric tenderness.   Assessment: 1. Abnormal liver tests. The CT and MRCP did not show any sign of dilated ducts or retain CBD stone. The patient has been taking some type of supplements from the health food store does appear to have a inflammatory pattern rather than cholecystectomy pattern. 2. Epigastric abdominal pain. Patient has been on NSAIDs for pain could have ulcer or some other cause of this pain. Still possible there could be something in his CBD is less likely 3. Status post cholecystectomy with ERCP/ERS 2012  Plan: 1. We will plan EGD in the morning. Have discussed this with the patient is agreeable. 2. We'll obtain labs  to look for other causes of elevated liver tests. And explained to him that this could have been the result of the supplements that he was taking from the health food store.   Douglas Cole  Douglas Cole. 12/20/2013, 2:37 PM

## 2013-12-21 NOTE — Interval H&P Note (Signed)
History and Physical Interval Note:  12/21/2013 8:14 AM  Douglas Cole  has presented today for surgery, with the diagnosis of EG pain  The various methods of treatment have been discussed with the patient and family. After consideration of risks, benefits and other options for treatment, the patient has consented to  Procedure(s): ESOPHAGOGASTRODUODENOSCOPY (EGD) (N/A) as a surgical intervention .  The patient's history has been reviewed, patient examined, no change in status, stable for surgery.  I have reviewed the patient's chart and labs.  Questions were answered to the patient's satisfaction.     Winfield Cunas.

## 2013-12-21 NOTE — Interval H&P Note (Signed)
History and Physical Interval Note:  12/21/2013 8:11 AM  Douglas Cole  has presented today for surgery, with the diagnosis of EG pain  The various methods of treatment have been discussed with the patient and family. After consideration of risks, benefits and other options for treatment, the patient has consented to  Procedure(s): ESOPHAGOGASTRODUODENOSCOPY (EGD) (N/A) as a surgical intervention .  The patient's history has been reviewed, patient examined, no change in status, stable for surgery.  I have reviewed the patient's chart and labs.  Questions were answered to the patient's satisfaction.     Winfield Cunas.

## 2013-12-21 NOTE — Op Note (Signed)
[facility] [place], [id number]   ENDOSCOPY PROCEDURE REPORT  PATIENT: Douglas, Cole  MR#: [id number] BIRTHDATE: 1961-04-27 , 52  yrs. old GENDER: Male ENDOSCOPIST:Areen Trautner, MD REFERRED BY:  [facility] PROCEDURE DATE:  12/21/2013 PROCEDURE:  EGD with Biopsy ASA CLASS:  class 2 INDICATIONS:   epigastric pain, elevated liver tests, history of taking NSAIDs type medication MEDICATION:    fentanyl 75 mcg, versed 9 mg IV TOPICAL ANESTHETIC:    cetacaine spray  DESCRIPTION OF PROCEDURE:   The procedure had been explained to the patient and consent obtained. In the left lateral position, the adult Pentax scope was inserted blindly into the esophagus. The entire esophagus was grossly normal with no esophagitis. There was no Barrett's esophagus or signs of esophageal varices. The stomach was entered. There was marked gastric inflammation with submucosal hemorrhages throughout the entire body of the stomach. This was diffuse and extended down to the antrum. Relatively speaking, the antrum reveal much less inflammation in the more proximal stomach. Multiple biopsies were taken in the stomach was quite friable. No ulcerations or masses were seen. The pyloric channel was normal. The duodenum was seen well down to the 2nd portion. There was a normal amount of biliary material in the 2nd duodenum. The ampulla was seen transiently from the side of the. Grossly normal. No gross mass was seen. The duodenal bulb was normal. The scope was withdrawn in the initial findings were confirmed. The patient tolerated procedure well.     COMPLICATIONS: None  ENDOSCOPIC IMPRESSION: 1. Marked  diffuse gastritis primarily in the proximal stomach. This could well be due to medications. Multiple biopsies taken. 2. Normal amount of biliary secretions in the 2nd duodenum. This would argue against obstruction of the bile duct as cause of his abnormal liver  test.  RECOMMENDATIONS: 1. will recommend avoiding all nonprescription medications particularly supplements and NSAIDs 2. Continue PPI therapy 3. Check pathology results    _______________________________ eSignedLaurence Spates, MD 12/21/2013 8:56 AM    PATIENT NAME:  Douglas, Cole MR#: [id number]

## 2013-12-21 NOTE — Progress Notes (Addendum)
[facility] PROGRESS NOTE  Douglas Cole GQQ:[id number] DOB: 06-23-1961 DOA: 12/19/2013 PCP: Douglas Amel, MD  Assessment/Plan: 1. Abd pain/Severe Gastritis -s/p cholecystectomy and sphincterotomy 9/12 -MRCP normal, ruled out retained CBD stone -drinks ETOH small amounts on weekends only -EGD with Severe gastritis, continue PPI, change to PO -avoid BCs and NSAID -Douglas Cole consult greatly appreciated  2. Abnormal LFTs -etiology unclear, suspect intrahepatic cholestasis -Hepatitis panel, ANA, ferritin normal -transaminases improving, but Bili up -may need Liver biopsy if remains unclear -Supplements could be contributing  3. Hyperglycemia -check hbaic  4. GERD -add PPI  DVT proph: SCDs, due to low plts  Code Status: Full Code Family Communication: d/w wife at bedside Disposition Plan: Home when improved   Consultants:  Douglas Cole  HPI/Subjective: abd pain improved,   Objective: Filed Vitals:   12/21/13 0918  BP: 106/71  Pulse:   Temp:   Resp: 17    Intake/Output Summary (Last 24 hours) at 12/21/13 1256 Last data filed at 12/21/13 0600  Gross per 24 hour  Intake 512.67 ml  Output    700 ml  Net -187.33 ml   Filed Weights   12/20/13 0054  Weight: 76.839 kg (169 lb 6.4 oz)    Exam:   General:  AAOx3,   HEENT: icterus  Cardiovascular: S1S2/RRR  Respiratory: CTAB  Abdomen: soft, NT, ND, BS present  Musculoskeletal: no edema c/c   Data Reviewed: Basic Metabolic Panel:  Recent Labs Lab 12/19/13 2030 12/20/13 0350 12/21/13 0333  NA 139 138 135*  K 3.9 4.5 3.9  CL 99 102 101  CO2 27 25 24   GLUCOSE 178* 167* 119*  BUN 17 13 10   CREATININE 0.90 0.85 0.82  CALCIUM 9.6 8.7 8.6   Liver Function Tests:  Recent Labs Lab 12/19/13 2030 12/20/13 0350 12/20/13 1609 12/21/13 0333  AST 408* 321*  --  125*  ALT 325* 340*  --  238*  ALKPHOS 142* 124*  --  125*  BILITOT 4.9* 5.2* 7.1* 7.2*  PROT 7.6 6.1  --  5.9*  ALBUMIN 4.2 3.2*   --  3.1*    Recent Labs Lab 12/19/13 2030 12/21/13 0333  LIPASE 27 17   No results found for this basename: AMMONIA,  in the last 168 hours CBC:  Recent Labs Lab 12/19/13 2030 12/20/13 0350 12/21/13 0333  WBC 7.4 10.0 5.7  NEUTROABS 6.8  --   --   HGB 14.4 12.6* 12.8*  HCT 41.3 36.5* 36.3*  MCV 83.3 85.1 83.3  PLT 104* 106* 96*   Cardiac Enzymes: No results found for this basename: CKTOTAL, CKMB, CKMBINDEX, TROPONINI,  in the last 168 hours BNP (last 3 results) No results found for this basename: PROBNP,  in the last 8760 hours CBG: No results found for this basename: GLUCAP,  in the last 168 hours  No results found for this or any previous visit (from the past 240 hour(s)).   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  EXAM: ORBITS FOR FOREIGN BODY - 2 VIEW  COMPARISON:  None.  FINDINGS: There is no evidence of metallic foreign body within the orbits. No significant bone abnormality identified.  IMPRESSION: No evidence of metallic foreign body within the orbits.   Electronically Signed   By: Douglas Cole M.D.   On: 12/20/2013 11:19   Ct Abdomen Pelvis W Contrast  12/19/2013  CLINICAL DATA:  Epigastric pain, back pain.  EXAM: CT ABDOMEN AND PELVIS WITH CONTRAST  TECHNIQUE: Multidetector CT imaging of the abdomen and pelvis was performed using the standard protocol following bolus administration of intravenous contrast.  CONTRAST:  157mL OMNIPAQUE IOHEXOL 300 MG/ML  SOLN  COMPARISON:  None.  FINDINGS: Visualized lung bases clear. Coronary calcifications noted. 2 cm probable hepatic cyst in hepatic segment 4. Additional subcentimeter low-attenuation foci, sharply demarcated for size and persisting on delayed scans, possibly also small cysts but too small to accurately characterize. Pneumobilia and gas in the nondilated distal CBD suggest previous  sphincterotomy. 12 mm nonspecific low-attenuation lesion in the spleen on image 20/2. No splenomegaly. Pancreas, right kidney, and adrenal glands unremarkable. Subcentimeter cyst, lower pole left kidney. No hydronephrosis. Surgical clips in the gallbladder fossa. Stomach is physiologically distended. Small bowel and colon are nondilated. Normal appendix. Urinary bladder is physiologically distended. There is moderate prostatic enlargement. No ascites. No free air. No adenopathy localized. Regional bones unremarkable.  IMPRESSION: 1. Atherosclerosis, including coronary artery disease. Please note that although the presence of coronary artery calcium documents the presence of coronary artery disease, the severity of this disease and any potential stenosis cannot be assessed on this non-gated CT examination. Assessment for potential risk factor modification, dietary therapy or pharmacologic therapy may be warranted, if clinically indicated. 2. Hepatic and splenic lesions possibly cysts but some incompletely characterized. 3. Prostatic enlargement.   Electronically Signed   By: Douglas Cole M.D.   On: 12/19/2013 21:58   Mr 3d Recon At Scanner  12/20/2013   CLINICAL DATA:  Abdominal pain. Elevated bilirubin. Evaluate for potential retained gallstones after cholecystectomy.  EXAM: MRI ABDOMEN WITHOUT AND WITH CONTRAST (INCLUDING MRCP)  TECHNIQUE: Multiplanar multisequence MR imaging of the abdomen was performed both before and after the administration of intravenous contrast. Heavily T2-weighted images of the biliary and pancreatic ducts were obtained, and three-dimensional MRCP images were rendered by post processing.  CONTRAST:  61mL MULTIHANCE GADOBENATE DIMEGLUMINE 529 MG/ML IV SOLN  COMPARISON:  CT the abdomen and pelvis 12/19/2013.  FINDINGS: Status post cholecystectomy. MRCP images demonstrate no intra or extrahepatic biliary ductal dilatation. No pancreatic ductal dilatation. No filling defects within the  common bile duct to suggest retained ductal stones. Common bile duct measures up to 7 mm in the porta hepatis. Numerous small hepatic lesions are low signal intensity on T1 weighted images, high signal intensity on T2 weighted images, and do not enhance, compatible with small cysts or biliary hamartomas. The largest of these lesions is in segment 4A measuring up to 1.6 x 1.2 cm, with an adjacent smaller lesion immediately abutting it. The appearance of the pancreas is unremarkable.  The appearance of the spleen, bilateral adrenal glands and right kidney is unremarkable. In the lower pole of the left kidney there is a sub cm lesion that is low T1 signal, high T2 signal and does not enhance, compatible with a small simple renal cyst.  IMPRESSION: 1. No retained stones in the common bile duct. No intra or extrahepatic biliary ductal dilatation to suggest biliary tract obstruction at this time. 2. Status post cholecystectomy. 3. Numerous small hepatic cysts or biliary hamartomas incidentally noted. 4. Tiny simple cyst in the lower pole of the left kidney.   Electronically Signed   By: Vinnie Langton M.D.   On: 12/20/2013 13:40   Dg Abd Acute W/chest  12/19/2013   CLINICAL DATA:  Upper abdominal pain, nausea, prior cholecystectomy  EXAM: ACUTE ABDOMEN SERIES (  ABDOMEN 2 VIEW & CHEST 1 VIEW)  COMPARISON:  None.  FINDINGS: Heart size and mediastinal contours are within normal limits.  Lungs are clear. No effusion. Surgical clips in the right upper abdomen.  No free air. Small bowel decompressed. Moderate colonic fecal material. Rectum is decompressed.  There are no abnormal calcifications.  Regional bones unremarkable.  IMPRESSION: No acute cardiopulmonary disease.  Nonobstructive bowel gas pattern with moderate colonic fecal material.   Electronically Signed   By: Douglas Cole M.D.   On: 12/19/2013 21:05   Mr Abd W/wo Cm/mrcp  12/20/2013   CLINICAL DATA:  Abdominal pain. Elevated bilirubin. Evaluate for  potential retained gallstones after cholecystectomy.  EXAM: MRI ABDOMEN WITHOUT AND WITH CONTRAST (INCLUDING MRCP)  TECHNIQUE: Multiplanar multisequence MR imaging of the abdomen was performed both before and after the administration of intravenous contrast. Heavily T2-weighted images of the biliary and pancreatic ducts were obtained, and three-dimensional MRCP images were rendered by post processing.  CONTRAST:  56mL MULTIHANCE GADOBENATE DIMEGLUMINE 529 MG/ML IV SOLN  COMPARISON:  CT the abdomen and pelvis 12/19/2013.  FINDINGS: Status post cholecystectomy. MRCP images demonstrate no intra or extrahepatic biliary ductal dilatation. No pancreatic ductal dilatation. No filling defects within the common bile duct to suggest retained ductal stones. Common bile duct measures up to 7 mm in the porta hepatis. Numerous small hepatic lesions are low signal intensity on T1 weighted images, high signal intensity on T2 weighted images, and do not enhance, compatible with small cysts or biliary hamartomas. The largest of these lesions is in segment 4A measuring up to 1.6 x 1.2 cm, with an adjacent smaller lesion immediately abutting it. The appearance of the pancreas is unremarkable.  The appearance of the spleen, bilateral adrenal glands and right kidney is unremarkable. In the lower pole of the left kidney there is a sub cm lesion that is low T1 signal, high T2 signal and does not enhance, compatible with a small simple renal cyst.  IMPRESSION: 1. No retained stones in the common bile duct. No intra or extrahepatic biliary ductal dilatation to suggest biliary tract obstruction at this time. 2. Status post cholecystectomy. 3. Numerous small hepatic cysts or biliary hamartomas incidentally noted. 4. Tiny simple cyst in the lower pole of the left kidney.   Electronically Signed   By: Vinnie Langton M.D.   On: 12/20/2013 13:40    Scheduled Meds: . pantoprazole (PROTONIX) IV  40 mg Intravenous Q24H  . sodium chloride  3 mL  Intravenous Q12H   Continuous Infusions: . sodium chloride 75 mL/hr at 12/20/13 0115   Antibiotics Given (last 72 hours)   None      Principal Problem:   Abdominal pain Active Problems:   Transaminitis   Tachycardia   GERD (gastroesophageal reflux disease)   Migraine    Time spent: 35min    [facility] Pager [phone]. If 7PM-7AM, please contact night-coverage at [email or web address], password [facility] 12/21/2013, 12:56 PM  LOS: 2 days

## 2013-12-22 DIAGNOSIS — K2900 Acute gastritis without bleeding: Secondary | ICD-10-CM | POA: Diagnosis present

## 2013-12-22 HISTORY — DX: Acute gastritis without bleeding: K29.00

## 2013-12-22 LAB — COMPREHENSIVE METABOLIC PANEL
ALT: 181 U/L — ABNORMAL HIGH (ref 0–53)
AST: 76 U/L — ABNORMAL HIGH (ref 0–37)
Albumin: 2.9 g/dL — ABNORMAL LOW (ref 3.5–5.2)
Alkaline Phosphatase: 146 U/L — ABNORMAL HIGH (ref 39–117)
BILIRUBIN TOTAL: 4 mg/dL — AB (ref 0.3–1.2)
BUN: 10 mg/dL (ref 6–23)
CHLORIDE: 103 meq/L (ref 96–112)
CO2: 26 meq/L (ref 19–32)
CREATININE: 0.97 mg/dL (ref 0.50–1.35)
Calcium: 8.7 mg/dL (ref 8.4–10.5)
GFR calc Af Amer: >90 mL/min (ref >90)
GLUCOSE: 120 mg/dL — AB (ref 70–99)
Potassium: 3.5 mEq/L — ABNORMAL LOW (ref 3.7–5.3)
Sodium: 139 mEq/L (ref 137–147)
Total Protein: 6.1 g/dL (ref 6.0–8.3)

## 2013-12-22 MED ORDER — PANTOPRAZOLE SODIUM 40 MG PO TBEC: 40.0000 mg | DELAYED_RELEASE_TABLET | Freq: Two times a day (BID) | ORAL | Status: DC

## 2013-12-22 MED ORDER — UNABLE TO FIND: Status: DC

## 2013-12-22 NOTE — Progress Notes (Signed)
Patient discharged home with family, discharge instructions given and explained to patient and he verbalized understanding, denies any pain/distress. No wound noted, skin intact. Accompanied home by family.

## 2013-12-22 NOTE — Discharge Summary (Signed)
Physician Discharge Summary  Douglas Cole UXL:[id number] DOB: 11-10-1960 DOA: 12/19/2013  PCP: Lujean Amel, MD  Admit date: 12/19/2013 Discharge date: 12/22/2013  Time spent: 45 minutes  Recommendations for Outpatient Follow-up:  1. Dr.Koirala in week and Repeat Cmet then 2. Dr.Edwards in 2 weeks, FU biopsy   Discharge Diagnoses:  Principal Problem:   Abdominal pain Active Problems:   Transaminitis   Tachycardia   GERD (gastroesophageal reflux disease)   Migraine   Gastritis, acute   H/o anal fissure  Discharge Condition:stable  Diet recommendation: regular  Filed Weights   12/20/13 0054  Weight: 76.839 kg (169 lb 6.4 oz)    History of present illness:  Douglas Cole is a 53 y.o. male past medical history significant for migraines, GERD and history of cholecystectomy (2012); who presented to the ED complaining of excruciating abdominal pain (mid epigastric to right upper quadrant). Patient reports the pain started on an of and has been present for the last 3 days now being today the worst. He endorses that the pain increased with food intake and because of that has not been eating and drinking much throughout the day; he also endorses some nausea but no vomiting. Patient denies any melena, hematochezia, dysuria, hematemesis, fever, chills, headaches or any other abnormalities. He reports no increase the use of pain states, alcohol or any other drug with liver toxicity potential.  In the ED workup demonstrated elevated alkaline phosphatase, AST/ALT and also elevated bilirubin; physical exam with mild icterus and ongoing abdominal pain with difficulty keeping by mouth. [facility] hospitalist has been called to admit the patient for further evaluation and treatment.   Hospital Course:  1. Abd pain/Severe Gastritis -s/p cholecystectomy and sphincterotomy in 9/12  -MRCP done which was normal and ruled out retained CBD stone  -followed by Dr.Edwards, EGD showed Severe gastritis,   -started on PPI -avoid BCs and NSAIDs  -FU with Dr.Edwards for Biopsy  2. Abnormal LFTs, elevated Bili and mild transminases - suspect intrahepatic cholestasis due to supplements-was taking supplements containing multitude of vitamins, aminoacids etc -Hepatitis panel, ANA, ferritin normal  -transaminases improving, bili improving -since clinically asymptomatic now and LFts improving, advised to stop the supplements and have labs repeated in 1 week  3. Hyperglycemia  -likely stress response -hbaic 5.3  4. GERD  -continue PPI   Procedures: 4/24: EGD:  Marked diffuse gastritis primarily in the proximal stomach. This  could well be due to medications. Multiple biopsies taken.  2. Normal amount of biliary secretions in the 2nd duodenum. This  would argue against obstruction of the bile duct as cause of his  abnormal liver test   Consultations:  GI-Dr.Edwards  Discharge Exam: Filed Vitals:   12/22/13 0610  BP: 117/80  Pulse: 63  Temp: 98.6 F (37 C)  Resp: 20    General: AAOx3, mild icterus Cardiovascular: S1S2/RRR Respiratory: CTAB  Discharge Instructions You were cared for by a hospitalist during your hospital stay. If you have any questions about your discharge medications or the care you received while you were in the hospital after you are discharged, you can call the unit and asked to speak with the hospitalist on call if the hospitalist that took care of you is not available. Once you are discharged, your primary care physician will handle any further medical issues. Please note that NO REFILLS for any discharge medications will be authorized once you are discharged, as it is imperative that you return to your primary care physician (or establish a relationship  with a primary care physician if you do not have one) for your aftercare needs so that they can reassess your need for medications and monitor your lab values.  Discharge Orders   Future Orders Complete By  Expires   Diet general  As directed    Diet general  As directed    Increase activity slowly  As directed    Increase activity slowly  As directed        Medication List    STOP taking these medications       BC FAST PAIN RELIEF [phone] MG Pack  Generic drug:  Aspirin-Salicylamide-Caffeine      TAKE these medications       calcium carbonate 500 MG chewable tablet  Commonly known as:  TUMS - dosed in mg elemental calcium  Chew 1 tablet by mouth 2 (two) times daily as needed for indigestion or heartburn (indigestion).     multivitamin with minerals tablet  Take 1 tablet by mouth daily.     pantoprazole 40 MG tablet  Commonly known as:  PROTONIX  Take 1 tablet (40 mg total) by mouth 2 (two) times daily.     polyethylene glycol packet  Commonly known as:  MIRALAX / GLYCOLAX  Take 17 g by mouth daily.       Allergies  Allergen Reactions  . Penicillins Rash       Follow-up Information   Follow up with Lujean Amel, MD. Schedule an appointment as soon as possible for a visit in 1 week.   Specialty:  Family Medicine   Contact information:   [address] [place] [address] [phone]       Follow up with Winfield Cunas, MD. Schedule an appointment as soon as possible for a visit in 2 weeks.   Specialty:  Gastroenterology   Contact information:   [place] [address] [phone]       Follow up with [facility] In 1 week.       The results of significant diagnostics from this hospitalization (including imaging, microbiology, ancillary and laboratory) are listed below for reference.    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  EXAM: ORBITS FOR FOREIGN BODY - 2 VIEW  COMPARISON:  None.  FINDINGS: There is no evidence of metallic  foreign body within the orbits. No significant bone abnormality identified.  IMPRESSION: No evidence of metallic foreign body within the orbits.   Electronically Signed   By: [name]   On: 12/20/2013 11:19   Ct Abdomen Pelvis W Contrast  12/19/2013   CLINICAL DATA:  Epigastric pain, back pain.  EXAM: CT ABDOMEN AND PELVIS WITH CONTRAST  TECHNIQUE: Multidetector CT imaging of the abdomen and pelvis was performed using the standard protocol following bolus administration of intravenous contrast.  CONTRAST:  141mL OMNIPAQUE IOHEXOL 300 MG/ML  SOLN  COMPARISON:  None.  FINDINGS: Visualized lung bases clear. Coronary calcifications noted. 2 cm probable hepatic cyst in hepatic segment 4. Additional subcentimeter low-attenuation foci, sharply demarcated for size and persisting on delayed scans, possibly also small cysts but too small to accurately characterize. Pneumobilia and gas in the nondilated distal CBD  suggest previous sphincterotomy. 12 mm nonspecific low-attenuation lesion in the spleen on image 20/2. No splenomegaly. Pancreas, right kidney, and adrenal glands unremarkable. Subcentimeter cyst, lower pole left kidney. No hydronephrosis. Surgical clips in the gallbladder fossa. Stomach is physiologically distended. Small bowel and colon are nondilated. Normal appendix. Urinary bladder is physiologically distended. There is moderate prostatic enlargement. No ascites. No free air. No adenopathy localized. Regional bones unremarkable.  IMPRESSION: 1. Atherosclerosis, including coronary artery disease. Please note that although the presence of coronary artery calcium documents the presence of coronary artery disease, the severity of this disease and any potential stenosis cannot be assessed on this non-gated CT examination. Assessment for potential risk factor modification, dietary therapy or pharmacologic therapy may be warranted, if clinically indicated. 2. Hepatic and splenic lesions possibly cysts but  some incompletely characterized. 3. Prostatic enlargement.   Electronically Signed   By: [name]   On: 12/19/2013 21:58   Mr 3d Recon At Scanner  12/20/2013   CLINICAL DATA:  Abdominal pain. Elevated bilirubin. Evaluate for potential retained gallstones after cholecystectomy.  EXAM: MRI ABDOMEN WITHOUT AND WITH CONTRAST (INCLUDING MRCP)  TECHNIQUE: Multiplanar multisequence MR imaging of the abdomen was performed both before and after the administration of intravenous contrast. Heavily T2-weighted images of the biliary and pancreatic ducts were obtained, and three-dimensional MRCP images were rendered by post processing.  CONTRAST:  12mL MULTIHANCE GADOBENATE DIMEGLUMINE 529 MG/ML IV SOLN  COMPARISON:  CT the abdomen and pelvis 12/19/2013.  FINDINGS: Status post cholecystectomy. MRCP images demonstrate no intra or extrahepatic biliary ductal dilatation. No pancreatic ductal dilatation. No filling defects within the common bile duct to suggest retained ductal stones. Common bile duct measures up to 7 mm in the porta hepatis. Numerous small hepatic lesions are low signal intensity on T1 weighted images, high signal intensity on T2 weighted images, and do not enhance, compatible with small cysts or biliary hamartomas. The largest of these lesions is in segment 4A measuring up to 1.6 x 1.2 cm, with an adjacent smaller lesion immediately abutting it. The appearance of the pancreas is unremarkable.  The appearance of the spleen, bilateral adrenal glands and right kidney is unremarkable. In the lower pole of the left kidney there is a sub cm lesion that is low T1 signal, high T2 signal and does not enhance, compatible with a small simple renal cyst.  IMPRESSION: 1. No retained stones in the common bile duct. No intra or extrahepatic biliary ductal dilatation to suggest biliary tract obstruction at this time. 2. Status post cholecystectomy. 3. Numerous small hepatic cysts or biliary hamartomas incidentally  noted. 4. Tiny simple cyst in the lower pole of the left kidney.   Electronically Signed   By: Vinnie Langton M.D.   On: 12/20/2013 13:40   Dg Abd Acute W/chest  12/19/2013   CLINICAL DATA:  Upper abdominal pain, nausea, prior cholecystectomy  EXAM: ACUTE ABDOMEN SERIES (ABDOMEN 2 VIEW & CHEST 1 VIEW)  COMPARISON:  None.  FINDINGS: Heart size and mediastinal contours are within normal limits.  Lungs are clear. No effusion. Surgical clips in the right upper abdomen.  No free air. Small bowel decompressed. Moderate colonic fecal material. Rectum is decompressed.  There are no abnormal calcifications.  Regional bones unremarkable.  IMPRESSION: No acute cardiopulmonary disease.  Nonobstructive bowel gas pattern with moderate colonic fecal material.   Electronically Signed   By: [name]   On: 12/19/2013 21:05   Mr Jeananne Rama W/wo Cm/mrcp  12/20/2013  CLINICAL DATA:  Abdominal pain. Elevated bilirubin. Evaluate for potential retained gallstones after cholecystectomy.  EXAM: MRI ABDOMEN WITHOUT AND WITH CONTRAST (INCLUDING MRCP)  TECHNIQUE: Multiplanar multisequence MR imaging of the abdomen was performed both before and after the administration of intravenous contrast. Heavily T2-weighted images of the biliary and pancreatic ducts were obtained, and three-dimensional MRCP images were rendered by post processing.  CONTRAST:  65mL MULTIHANCE GADOBENATE DIMEGLUMINE 529 MG/ML IV SOLN  COMPARISON:  CT the abdomen and pelvis 12/19/2013.  FINDINGS: Status post cholecystectomy. MRCP images demonstrate no intra or extrahepatic biliary ductal dilatation. No pancreatic ductal dilatation. No filling defects within the common bile duct to suggest retained ductal stones. Common bile duct measures up to 7 mm in the porta hepatis. Numerous small hepatic lesions are low signal intensity on T1 weighted images, high signal intensity on T2 weighted images, and do not enhance, compatible with small cysts or biliary hamartomas.  The largest of these lesions is in segment 4A measuring up to 1.6 x 1.2 cm, with an adjacent smaller lesion immediately abutting it. The appearance of the pancreas is unremarkable.  The appearance of the spleen, bilateral adrenal glands and right kidney is unremarkable. In the lower pole of the left kidney there is a sub cm lesion that is low T1 signal, high T2 signal and does not enhance, compatible with a small simple renal cyst.  IMPRESSION: 1. No retained stones in the common bile duct. No intra or extrahepatic biliary ductal dilatation to suggest biliary tract obstruction at this time. 2. Status post cholecystectomy. 3. Numerous small hepatic cysts or biliary hamartomas incidentally noted. 4. Tiny simple cyst in the lower pole of the left kidney.   Electronically Signed   By: Vinnie Langton M.D.   On: 12/20/2013 13:40    Microbiology: No results found for this or any previous visit (from the past 240 hour(s)).   Labs: Basic Metabolic Panel:  Recent Labs Lab 12/19/13 2030 12/20/13 0350 12/21/13 0333 12/22/13 0415  NA 139 138 135* 139  K 3.9 4.5 3.9 3.5*  CL 99 102 101 103  CO2 27 25 24 26   GLUCOSE 178* 167* 119* 120*  BUN 17 13 10 10   CREATININE 0.90 0.85 0.82 0.97  CALCIUM 9.6 8.7 8.6 8.7   Liver Function Tests:  Recent Labs Lab 12/19/13 2030 12/20/13 0350 12/20/13 1609 12/21/13 0333 12/22/13 0415  AST 408* 321*  --  125* 76*  ALT 325* 340*  --  238* 181*  ALKPHOS 142* 124*  --  125* 146*  BILITOT 4.9* 5.2* 7.1* 7.2* 4.0*  PROT 7.6 6.1  --  5.9* 6.1  ALBUMIN 4.2 3.2*  --  3.1* 2.9*    Recent Labs Lab 12/19/13 2030 12/21/13 0333  LIPASE 27 17   No results found for this basename: AMMONIA,  in the last 168 hours CBC:  Recent Labs Lab 12/19/13 2030 12/20/13 0350 12/21/13 0333  WBC 7.4 10.0 5.7  NEUTROABS 6.8  --   --   HGB 14.4 12.6* 12.8*  HCT 41.3 36.5* 36.3*  MCV 83.3 85.1 83.3  PLT 104* 106* 96*   Cardiac Enzymes: No results found for this  basename: CKTOTAL, CKMB, CKMBINDEX, TROPONINI,  in the last 168 hours BNP: BNP (last 3 results) No results found for this basename: PROBNP,  in the last 8760 hours CBG: No results found for this basename: GLUCAP,  in the last 168 hours     Signed:  Domenic Polite  [facility] 12/22/2013, 10:03  AM    

## 2013-12-24 ENCOUNTER — Encounter (HOSPITAL_COMMUNITY): Payer: Self-pay | Admitting: Gastroenterology

## 2013-12-24 LAB — ANA: Anti Nuclear Antibody(ANA): NEGATIVE

## 2013-12-24 LAB — MITOCHONDRIAL ANTIBODIES: Mitochondrial M2 Ab, IgG: 0.7 (ref <0.91)

## 2013-12-24 LAB — CERULOPLASMIN: Ceruloplasmin: 26 mg/dL (ref 18–36)

## 2013-12-24 LAB — ANTI-SMOOTH MUSCLE ANTIBODY, IGG
F-ACTIN AB IGG: 9 U (ref <20)
F-ACTIN AB IGG: 8 U (ref <20)

## 2013-12-24 NOTE — ED Provider Notes (Signed)
Medical screening examination/treatment/procedure(s) were conducted as a shared visit with non-physician practitioner(s) and myself.  I personally evaluated the patient during the encounter.   EKG Interpretation   Date/Time:  Thursday December 20 2013 00:30:13 EDT Ventricular Rate:  106 PR Interval:  153 QRS Duration: 78 QT Interval:  316 QTC Calculation: 420 R Axis:   24 Text Interpretation:  Sinus tachycardia Low voltage, precordial leads  Borderline T abnormalities, inferior leads ED PHYSICIAN INTERPRETATION  AVAILABLE IN [facility] Confirmed by TEST, Record ([id number]) on  12/23/2013 7:56:53 AM      Patient with acute abdominal pain, concern for gastric ulcer vs perforation. CT negative for free air or acute cause. Consult GI, admit  Ephraim Hamburger, MD 12/24/13 [phone]

## 2014-11-29 ENCOUNTER — Encounter (HOSPITAL_COMMUNITY): Payer: Self-pay | Admitting: *Deleted

## 2014-12-03 ENCOUNTER — Other Ambulatory Visit: Payer: Self-pay | Admitting: Gastroenterology

## 2014-12-03 NOTE — Addendum Note (Signed)
Addended by: Arta Silence on: 12/03/2014 05:08 PM   Modules accepted: Orders

## 2014-12-04 ENCOUNTER — Encounter (HOSPITAL_COMMUNITY)
Admission: RE | Disposition: A | Discharge status: Home Health | Payer: Self-pay | Source: Ambulatory Visit | Attending: Gastroenterology

## 2014-12-04 ENCOUNTER — Encounter (HOSPITAL_COMMUNITY): Payer: Self-pay | Admitting: *Deleted

## 2014-12-04 ENCOUNTER — Ambulatory Visit (HOSPITAL_COMMUNITY)
Admission: RE | Admit: 2014-12-04 | Discharge: 2014-12-04 | Disposition: A | Discharge status: Home Health | Payer: BC Managed Care – PPO | Source: Ambulatory Visit | Attending: Gastroenterology | Admitting: Gastroenterology

## 2014-12-04 ENCOUNTER — Ambulatory Visit (HOSPITAL_COMMUNITY): Payer: BC Managed Care – PPO | Admitting: Anesthesiology

## 2014-12-04 DIAGNOSIS — K219 Gastro-esophageal reflux disease without esophagitis: Secondary | ICD-10-CM | POA: Insufficient documentation

## 2014-12-04 DIAGNOSIS — Z9049 Acquired absence of other specified parts of digestive tract: Secondary | ICD-10-CM | POA: Insufficient documentation

## 2014-12-04 DIAGNOSIS — K838 Other specified diseases of biliary tract: Secondary | ICD-10-CM | POA: Insufficient documentation

## 2014-12-04 DIAGNOSIS — R1013 Epigastric pain: Secondary | ICD-10-CM | POA: Diagnosis present

## 2014-12-04 DIAGNOSIS — R7989 Other specified abnormal findings of blood chemistry: Secondary | ICD-10-CM | POA: Diagnosis present

## 2014-12-04 HISTORY — PX: EUS: SHX5427

## 2014-12-04 HISTORY — DX: Gastro-esophageal reflux disease without esophagitis: K21.9

## 2014-12-04 HISTORY — DX: Abnormal levels of other serum enzymes: R74.8

## 2014-12-04 SURGERY — ESOPHAGEAL ENDOSCOPIC ULTRASOUND (EUS) RADIAL
Anesthesia: General

## 2014-12-04 MED ORDER — SODIUM CHLORIDE 0.9 % IV SOLN: INTRAVENOUS | Status: DC

## 2014-12-04 MED ORDER — SUCCINYLCHOLINE CHLORIDE 20 MG/ML IJ SOLN
INTRAMUSCULAR | Status: DC | PRN
  Administered 2014-12-04: 100 mg via INTRAVENOUS

## 2014-12-04 MED ORDER — PROPOFOL 10 MG/ML IV BOLUS
INTRAVENOUS | Status: AC
  Filled 2014-12-04: qty 20

## 2014-12-04 MED ORDER — CIPROFLOXACIN IN D5W 400 MG/200ML IV SOLN
INTRAVENOUS | Status: AC
  Filled 2014-12-04: qty 200

## 2014-12-04 MED ORDER — FENTANYL CITRATE 0.05 MG/ML IJ SOLN
INTRAMUSCULAR | Status: AC
  Filled 2014-12-04: qty 2

## 2014-12-04 MED ORDER — LIDOCAINE HCL (CARDIAC) 20 MG/ML IV SOLN
INTRAVENOUS | Status: AC
  Filled 2014-12-04: qty 5

## 2014-12-04 MED ORDER — FENTANYL CITRATE 0.05 MG/ML IJ SOLN
INTRAMUSCULAR | Status: DC | PRN
  Administered 2014-12-04: 100 ug via INTRAVENOUS

## 2014-12-04 MED ORDER — LACTATED RINGERS IV SOLN
INTRAVENOUS | Status: DC
  Administered 2014-12-04: 1000 mL via INTRAVENOUS

## 2014-12-04 MED ORDER — GLUCAGON HCL RDNA (DIAGNOSTIC) 1 MG IJ SOLR
INTRAMUSCULAR | Status: AC
  Filled 2014-12-04: qty 1

## 2014-12-04 MED ORDER — PROPOFOL 10 MG/ML IV BOLUS
INTRAVENOUS | Status: DC | PRN
  Administered 2014-12-04: 200 mg via INTRAVENOUS

## 2014-12-04 MED ORDER — LIDOCAINE HCL (CARDIAC) 20 MG/ML IV SOLN
INTRAVENOUS | Status: DC | PRN
  Administered 2014-12-04: 100 mg via INTRAVENOUS

## 2014-12-04 NOTE — Discharge Instructions (Signed)
Endoscopic ultrasound  Care After Please read the instructions outlined below and refer to this sheet in the next few weeks. These discharge instructions provide you with general information on caring for yourself after you leave the hospital. Your doctor may also give you specific instructions. While your treatment has been planned according to the most current medical practices available, unavoidable complications occasionally occur. If you have any problems or questions after discharge, please call Dr. Paulita Fujita [facility]) at [phone].  HOME CARE INSTRUCTIONS Activity  You may resume your regular activity but move at a slower pace for the next 24 hours.   Take frequent rest periods for the next 24 hours.   Walking will help expel (get rid of) the air and reduce the bloated feeling in your abdomen.   No driving for 24 hours (because of the anesthesia (medicine) used during the test).   You may shower.   Do not sign any important legal documents or operate any machinery for 24 hours (because of the anesthesia used during the test).  Nutrition  Drink plenty of fluids.   You may resume your normal diet.   Begin with a light meal and progress to your normal diet.   Avoid alcoholic beverages for 24 hours or as instructed by your caregiver.  Medications You may resume your normal medications unless your caregiver tells you otherwise. What you can expect today  You may experience abdominal discomfort such as a feeling of fullness or "gas" pains.   You may experience a sore throat for 2 to 3 days. This is normal. Gargling with salt water may help this.    SEEK IMMEDIATE MEDICAL CARE IF:  You have excessive nausea (feeling sick to your stomach) and/or vomiting.   You have severe abdominal pain and distention (swelling).   You have trouble swallowing.   You have a temperature over 100 F (37.8 C).   You have rectal bleeding or vomiting of blood.  Document  Released: 03/30/2004 Document Revised: 04/28/2011 Document Reviewed: 10/11/2007 [facility] Patient Information 2012 [place].

## 2014-12-04 NOTE — Anesthesia Postprocedure Evaluation (Signed)
  Anesthesia Post-op Note  Patient: Douglas Cole  Procedure(s) Performed: Procedure(s): ESOPHAGEAL ENDOSCOPIC ULTRASOUND (EUS) RADIAL (N/A) ENDOSCOPIC RETROGRADE CHOLANGIOPANCREATOGRAPHY (ERCP) (N/A)  Patient Location: PACU  Anesthesia Type:General  Level of Consciousness: awake  Airway and Oxygen Therapy: Patient Spontanous Breathing  Post-op Pain: mild  Post-op Assessment: Post-op Vital signs reviewed  Post-op Vital Signs: Reviewed  Last Vitals:  Filed Vitals:   12/04/14 1030  BP: 113/59  Pulse: 84  Temp: 36.6 C  Resp: 15    Complications: No apparent anesthesia complications

## 2014-12-04 NOTE — H&P (Signed)
Patient interval history reviewed.  Patient examined again.  There has been no change from documented H/P dated 11/25/14 (scanned into chart from our office) except as documented above.  Assessment.  1.  Elevated LFTs, downtrending. 2.  Epigastric pain, resolved. 3.  Clinical concern for either passed CBD stone versus ball-valving CBD stone.  Plan:  1.  Endoscopic ultrasound. 2.  Risks (bleeding, infection, bowel perforation that could require surgery, sedation-related changes in cardiopulmonary systems), benefits (identification and possible treatment of source of symptoms, exclusion of certain causes of symptoms), and alternatives (watchful waiting, radiographic imaging studies, empiric medical treatment) of upper endoscopy with ultrasound (EUS) were explained to patient/family in detail and patient wishes to proceed. 3.  If CBD stone is noted on EUS, would proceed directly with ERCP. 4.  Risks (up to and including bleeding, infection, perforation, pancreatitis that can be complicated by infected necrosis and death), benefits (removal of stones, alleviating blockage, decreasing risk of cholangitis or choledocholithiasis-related pancreatitis), and alternatives (watchful waiting, percutaneous transhepatic cholangiography) of ERCP were explained to patient/family in detail and patient elects to proceed.

## 2014-12-04 NOTE — Op Note (Signed)
[facility] [place], [address]   ENDOSCOPIC ULTRASOUND PROCEDURE REPORT  PATIENT: Douglas Cole, Douglas Cole  MR#: [id number] BIRTHDATE: 1961-01-23  GENDER: male ENDOSCOPIST: Arta Silence, MD REFERRED BY:  Lujean Amel, M.D.  Laurence Spates, M.D. PROCEDURE DATE:  12/04/2014 PROCEDURE:   Upper EUS ASA CLASS:      Class II INDICATIONS:   1.  abdominal pain, elevated LFTs. MEDICATIONS: MAC per CRNA  DESCRIPTION OF PROCEDURE:   After the risks benefits and alternatives of the procedure were  explained, informed consent was obtained. The patient was then placed in the left, lateral, decubitus postion and IV sedation was administered. Throughout the procedure, the patients blood pressure, pulse and oxygen saturations were monitored continuously.  Under direct visualization, the EUS scope [id number]  endoscope was introduced through the mouth  and advanced to the second portion of the duodenum .  Water was used as necessary to provide an acoustic interface.  Upon completion of the imaging, water was removed and the patient was sent to the recovery room in satisfactory condition.     FINDINGS:      Bile duct non-dilated ( 50mm) but had mild symmetric wall thickening without focal mass or bile duct stone.  No mass seen in head/uncinate pancreas.  Post-cholecystectomy.  IMPRESSION:     As above.  Suspect patient passed bile duct stone. Symmetrical wall thickening is likely reactive from prior suspected choledocholithiasis.  RECOMMENDATIONS:     1.  Watch for potential complications of procedure. 2.  Will discuss with Dr. Oletta Lamas.   _______________________________ Lorrin MaisArta Silence, MD 12/04/2014 10:28 AM   CC:

## 2014-12-04 NOTE — Addendum Note (Signed)
Addendum  created 12/04/14 1305 by Sharlette Dense, CRNA   Modules edited: Anesthesia Attestations

## 2014-12-04 NOTE — Anesthesia Procedure Notes (Signed)
Procedure Name: Intubation Date/Time: 12/04/2014 9:46 AM Performed by: Danley Danker L Patient Re-evaluated:Patient Re-evaluated prior to inductionOxygen Delivery Method: Circle system utilized Preoxygenation: Pre-oxygenation with 100% oxygen Intubation Type: IV induction Ventilation: Mask ventilation without difficulty Laryngoscope Size: Miller and 3 Grade View: Grade I Tube type: Oral Tube size: 7.5 mm Number of attempts: 1 Airway Equipment and Method: Stylet Placement Confirmation: ETT inserted through vocal cords under direct vision,  breath sounds checked- equal and bilateral and positive ETCO2 Secured at: 21 cm Tube secured with: Tape Dental Injury: Teeth and Oropharynx as per pre-operative assessment

## 2014-12-04 NOTE — Anesthesia Preprocedure Evaluation (Addendum)
Anesthesia Evaluation  Patient identified by MRN, date of birth, ID band Patient awake    Reviewed: Allergy & Precautions, NPO status , Patient's Chart, lab work & pertinent test results  Airway Mallampati: II  TM Distance: >3 FB Neck ROM: Full    Dental   Pulmonary neg pulmonary ROS,          Cardiovascular negative cardio ROS      Neuro/Psych    GI/Hepatic GERD-  ,History noted. CE   Endo/Other  negative endocrine ROS  Renal/GU      Musculoskeletal   Abdominal   Peds  Hematology   Anesthesia Other Findings   Reproductive/Obstetrics                             Anesthesia Physical Anesthesia Plan  ASA: II  Anesthesia Plan: General   Post-op Pain Management:    Induction: Intravenous  Airway Management Planned: Oral ETT  Additional Equipment:   Intra-op Plan:   Post-operative Plan: Extubation in OR  Informed Consent: I have reviewed the patients History and Physical, chart, labs and discussed the procedure including the risks, benefits and alternatives for the proposed anesthesia with the patient or authorized representative who has indicated his/her understanding and acceptance.   Dental advisory given  Plan Discussed with: CRNA and Anesthesiologist  Anesthesia Plan Comments:        Anesthesia Quick Evaluation

## 2014-12-04 NOTE — Transfer of Care (Signed)
Immediate Anesthesia Transfer of Care Note  Patient: Douglas Cole  Procedure(s) Performed: Procedure(s): ESOPHAGEAL ENDOSCOPIC ULTRASOUND (EUS) RADIAL (N/A) ENDOSCOPIC RETROGRADE CHOLANGIOPANCREATOGRAPHY (ERCP) (N/A)  Patient Location: Endoscopy Unit  Anesthesia Type:General  Level of Consciousness: awake, alert  and oriented  Airway & Oxygen Therapy: Patient Spontanous Breathing and Patient connected to nasal cannula oxygen  Post-op Assessment: Report given to RN and Post -op Vital signs reviewed and stable  Post vital signs: Reviewed and stable  Last Vitals:  Filed Vitals:   12/04/14 0821  BP: 132/87  Pulse: 81  Temp: 36.5 C  Resp: 12    Complications: No apparent anesthesia complications

## 2014-12-05 ENCOUNTER — Encounter (HOSPITAL_COMMUNITY): Payer: Self-pay | Admitting: Gastroenterology

## 2018-06-16 ENCOUNTER — Other Ambulatory Visit: Payer: Self-pay | Admitting: Urology

## 2018-06-16 DIAGNOSIS — C61 Malignant neoplasm of prostate: Secondary | ICD-10-CM

## 2018-07-11 ENCOUNTER — Ambulatory Visit
Admission: RE | Admit: 2018-07-11 | Discharge: 2018-07-11 | Disposition: A | Discharge status: Home / Self Care | Payer: BC Managed Care – PPO | Source: Ambulatory Visit | Attending: Urology | Admitting: Urology

## 2018-07-11 DIAGNOSIS — C61 Malignant neoplasm of prostate: Secondary | ICD-10-CM

## 2018-07-11 MED ORDER — GADOBENATE DIMEGLUMINE 529 MG/ML IV SOLN
15.0000 mL | Freq: Once | INTRAVENOUS | Status: AC | PRN
  Administered 2018-07-11: 15 mL via INTRAVENOUS

## 2021-05-25 ENCOUNTER — Other Ambulatory Visit: Payer: Self-pay

## 2021-05-25 ENCOUNTER — Encounter (HOSPITAL_COMMUNITY): Payer: Self-pay | Admitting: Orthopedic Surgery

## 2021-05-25 NOTE — Progress Notes (Addendum)
Mr. Douglas Cole denies chest pain or shortness of breath.  Patient denies having any s/s of Covid in his household.  Patient denies any known exposure to Covid.   Mr Douglas Cole reports that he has a heart murmer, I asked if he has been seen by a cardiologist, patent responded no.  PCP is Dr Lauretta Grill Tonny Bollman at [place], [place].  I have requested records.  Mr. Douglas Cole takes [facility] Powders for pain at times, I instructed patient to not take any more until Dr. Greta Doom gives you permission after surgery.  I instructed patient to shower with antibiotic soap, if it is available.  Dry off with a clean towel. Do not put lotion, powder, cologne or deodorant or makeup.No jewelry or piercings. Men may shave their face and neck. Woman should not shave. No nail polish, artificial or acrylic nails. Wear clean clothes, brush your teeth. Glasses, contact lens,dentures or partials may not be worn in the OR. If you need to wear them, please bring a case for glasses, do not wear contacts or bring a case, the hospital does not have contact cases, dentures or partials will have to be removed , make sure they are clean, we will provide a denture cup to put them in. You will need some one to drive you home and a responsible person over the age of 71 to stay with you for the first 24 hours after surgery.   05/26/21 1020 I have not received records from [facility] office, I called, left a voice message on medical records voice line.  I said that I had sent a request yesterday, 05/25/21 and I stressed how important it is to receive the information.

## 2021-05-26 NOTE — H&P (Signed)
Preoperative History & Physical Exam  Surgeon: Matt Holmes, MD  Diagnosis: Right elbow distal bicep rupture  Planned Procedure: Procedure(s) (LRB): Right elbow distal biceps repair, possible allograft reconstruction (Right)  History of Present Illness:   Patient is a 60 y.o. male with symptoms consistent with  Right elbow distal bicep rupture who presents for surgical intervention. The risks, benefits and alternatives of surgical intervention were discussed and informed consent was obtained prior to surgery.  Past Medical History:  Past Medical History:  Diagnosis Date   Cancer [facility])    prostate   Elevated liver enzymes last week   GERD (gastroesophageal reflux disease)    ocassional   Headache    Heart murmur     Past Surgical History:  Past Surgical History:  Procedure Laterality Date   CHOLECYSTECTOMY  05/28/11    lap chole    ESOPHAGOGASTRODUODENOSCOPY N/A 12/21/2013   Procedure: ESOPHAGOGASTRODUODENOSCOPY (EGD);  Surgeon: Winfield Cunas., MD;  Location: [facility];  Service: Endoscopy;  Laterality: N/A;   EUS N/A 12/04/2014   Procedure: ESOPHAGEAL ENDOSCOPIC ULTRASOUND (EUS) RADIAL;  Surgeon: Arta Silence, MD;  Location: [facility];  Service: Endoscopy;  Laterality: N/A;    Medications:  Prior to Admission medications   Medication Sig Start Date End Date Taking? Authorizing Provider  acetaminophen (TYLENOL) 500 MG tablet Take 1,000 mg by mouth every 6 (six) hours as needed.   Yes [provider]  Aspirin-Salicylamide-Caffeine (BC HEADACHE POWDER PO) Take 1 Package by mouth every 6 (six) hours as needed.   Yes [provider]    Allergies:  Penicillins  Review of Systems: Negative except per HPI.  Physical Exam: Alert and oriented, NAD Head and neck: no masses, normal alignment CV: pulse intact Pulm: no increased work of breathing, respirations even and unlabored Abdomen: non-distended Extremities: extremities warm and well  perfused  LABS: No results found for this or any previous visit (from the past 2160 hour(s)).   Complete History and Physical exam available in the office notes  Douglas Cole

## 2021-05-27 ENCOUNTER — Encounter (HOSPITAL_COMMUNITY)
Admission: RE | Disposition: A | Discharge status: Home / Self Care | Payer: Self-pay | Source: Home / Self Care | Attending: Orthopedic Surgery

## 2021-05-27 ENCOUNTER — Ambulatory Visit (HOSPITAL_COMMUNITY)
Admission: RE | Admit: 2021-05-27 | Discharge: 2021-05-27 | Disposition: A | Discharge status: Home / Self Care | Payer: Worker's Compensation | Attending: Orthopedic Surgery | Admitting: Orthopedic Surgery

## 2021-05-27 ENCOUNTER — Ambulatory Visit (HOSPITAL_COMMUNITY): Payer: Worker's Compensation | Admitting: Physician Assistant

## 2021-05-27 ENCOUNTER — Encounter (HOSPITAL_COMMUNITY): Payer: Self-pay | Admitting: Orthopedic Surgery

## 2021-05-27 DIAGNOSIS — Z880 Allergy status to penicillin: Secondary | ICD-10-CM | POA: Insufficient documentation

## 2021-05-27 DIAGNOSIS — Z8546 Personal history of malignant neoplasm of prostate: Secondary | ICD-10-CM | POA: Diagnosis not present

## 2021-05-27 DIAGNOSIS — S46211A Strain of muscle, fascia and tendon of other parts of biceps, right arm, initial encounter: Secondary | ICD-10-CM | POA: Insufficient documentation

## 2021-05-27 DIAGNOSIS — X58XXXA Exposure to other specified factors, initial encounter: Secondary | ICD-10-CM | POA: Diagnosis not present

## 2021-05-27 HISTORY — DX: Headache, unspecified: R51.9

## 2021-05-27 HISTORY — DX: Cardiac murmur, unspecified: R01.1

## 2021-05-27 HISTORY — PX: DISTAL BICEPS TENDON REPAIR: SHX1461

## 2021-05-27 HISTORY — DX: Malignant (primary) neoplasm, unspecified: C80.1

## 2021-05-27 SURGERY — REPAIR, TENDON, BICEPS, DISTAL
Anesthesia: Monitor Anesthesia Care | Laterality: Right

## 2021-05-27 MED ORDER — MIDAZOLAM HCL 5 MG/5ML IJ SOLN
INTRAMUSCULAR | Status: DC | PRN
  Administered 2021-05-27: 2 mg via INTRAVENOUS

## 2021-05-27 MED ORDER — CHLORHEXIDINE GLUCONATE 0.12 % MT SOLN
15.0000 mL | OROMUCOSAL | Status: AC
  Administered 2021-05-27: 15 mL via OROMUCOSAL
  Filled 2021-05-27 (×2): qty 15

## 2021-05-27 MED ORDER — ACETAMINOPHEN 500 MG PO TABS
1000.0000 mg | ORAL_TABLET | Freq: Once | ORAL | Status: AC
  Administered 2021-05-27: 1000 mg via ORAL
  Filled 2021-05-27: qty 2

## 2021-05-27 MED ORDER — ONDANSETRON HCL 4 MG/2ML IJ SOLN
INTRAMUSCULAR | Status: DC | PRN
  Administered 2021-05-27: 4 mg via INTRAVENOUS

## 2021-05-27 MED ORDER — CEFAZOLIN SODIUM-DEXTROSE 2-4 GM/100ML-% IV SOLN
2.0000 g | INTRAVENOUS | Status: AC
  Administered 2021-05-27: 2 g via INTRAVENOUS
  Filled 2021-05-27: qty 100

## 2021-05-27 MED ORDER — ROPIVACAINE HCL 5 MG/ML IJ SOLN
INTRAMUSCULAR | Status: DC | PRN
  Administered 2021-05-27: 25 mL via EPIDURAL

## 2021-05-27 MED ORDER — DEXAMETHASONE SODIUM PHOSPHATE 10 MG/ML IJ SOLN
INTRAMUSCULAR | Status: DC | PRN
  Administered 2021-05-27: 5 mg via INTRAVENOUS

## 2021-05-27 MED ORDER — PROPOFOL 10 MG/ML IV BOLUS
INTRAVENOUS | Status: DC | PRN
  Administered 2021-05-27: 10 mg via INTRAVENOUS

## 2021-05-27 MED ORDER — MIDAZOLAM HCL 2 MG/2ML IJ SOLN: 2.0000 mg | Freq: Once | INTRAMUSCULAR | Status: AC

## 2021-05-27 MED ORDER — LACTATED RINGERS IV SOLN: INTRAVENOUS | Status: DC

## 2021-05-27 MED ORDER — MIDAZOLAM HCL 2 MG/2ML IJ SOLN
INTRAMUSCULAR | Status: AC
  Filled 2021-05-27: qty 2

## 2021-05-27 MED ORDER — FENTANYL CITRATE (PF) 100 MCG/2ML IJ SOLN: 100.0000 ug | Freq: Once | INTRAMUSCULAR | Status: AC

## 2021-05-27 MED ORDER — 0.9 % SODIUM CHLORIDE (POUR BTL) OPTIME
TOPICAL | Status: DC | PRN
  Administered 2021-05-27: 1000 mL

## 2021-05-27 MED ORDER — MIDAZOLAM HCL 2 MG/2ML IJ SOLN
INTRAMUSCULAR | Status: AC
  Administered 2021-05-27: 2 mg via INTRAVENOUS
  Filled 2021-05-27: qty 2

## 2021-05-27 MED ORDER — FENTANYL CITRATE (PF) 100 MCG/2ML IJ SOLN
INTRAMUSCULAR | Status: AC
  Administered 2021-05-27: 100 ug via INTRAVENOUS
  Filled 2021-05-27: qty 2

## 2021-05-27 MED ORDER — PROPOFOL 500 MG/50ML IV EMUL
INTRAVENOUS | Status: DC | PRN
  Administered 2021-05-27: 100 ug/kg/min via INTRAVENOUS

## 2021-05-27 MED ORDER — DEXAMETHASONE SODIUM PHOSPHATE 10 MG/ML IJ SOLN
INTRAMUSCULAR | Status: DC | PRN
  Administered 2021-05-27: 5 mg

## 2021-05-27 SURGICAL SUPPLY — 73 items
ADH SKN CLS APL DERMABOND .7 (GAUZE/BANDAGES/DRESSINGS) ×1
BAG COUNTER SPONGE SURGICOUNT (BAG) ×2 IMPLANT
BAG SPNG CNTER NS LX DISP (BAG) ×1
BIT DRILL CANN ENDO 4.5 STRL (BIT) IMPLANT
BLADE CLIPPER SURG (BLADE) IMPLANT
BNDG CMPR 9X4 STRL LF SNTH (GAUZE/BANDAGES/DRESSINGS)
BNDG ELASTIC 3X5.8 VLCR STR LF (GAUZE/BANDAGES/DRESSINGS) ×2 IMPLANT
BNDG ELASTIC 4X5.8 VLCR STR LF (GAUZE/BANDAGES/DRESSINGS) ×2 IMPLANT
BNDG ELASTIC 6X5.8 VLCR STR LF (GAUZE/BANDAGES/DRESSINGS) ×1 IMPLANT
BNDG ESMARK 4X9 LF (GAUZE/BANDAGES/DRESSINGS) ×1 IMPLANT
BNDG GAUZE ELAST 4 BULKY (GAUZE/BANDAGES/DRESSINGS) ×2 IMPLANT
BUR SURG 4X8 MED (BURR) ×1 IMPLANT
BURR SURG 4X8 MED (BURR) ×2
CLIP VESOCCLUDE MED 6/CT (CLIP) IMPLANT
CLIP VESOCCLUDE SM WIDE 6/CT (CLIP) IMPLANT
CORD BIPOLAR FORCEPS 12FT (ELECTRODE) ×2 IMPLANT
COVER SURGICAL LIGHT HANDLE (MISCELLANEOUS) ×2 IMPLANT
CUFF TOURN SGL QUICK 18X4 (TOURNIQUET CUFF) ×1 IMPLANT
CUFF TOURN SGL QUICK 24 (TOURNIQUET CUFF)
CUFF TRNQT CYL 24X4X16.5-23 (TOURNIQUET CUFF) IMPLANT
DECANTER SPIKE VIAL GLASS SM (MISCELLANEOUS) IMPLANT
DERMABOND ADVANCED (GAUZE/BANDAGES/DRESSINGS) ×1
DERMABOND ADVANCED .7 DNX12 (GAUZE/BANDAGES/DRESSINGS) IMPLANT
DRAIN TLS ROUND 10FR (DRAIN) IMPLANT
DRAPE INCISE IOBAN 66X45 STRL (DRAPES) ×2 IMPLANT
DRAPE OEC MINIVIEW 54X84 (DRAPES) ×2 IMPLANT
DRAPE SURG 17X23 STRL (DRAPES) ×2 IMPLANT
DRAPE U-SHAPE 47X51 STRL (DRAPES) ×2 IMPLANT
DRSG ADAPTIC 3X8 NADH LF (GAUZE/BANDAGES/DRESSINGS) ×1 IMPLANT
DRSG PAD ABDOMINAL 8X10 ST (GAUZE/BANDAGES/DRESSINGS) ×1 IMPLANT
ELECT REM PT RETURN 9FT ADLT (ELECTROSURGICAL) ×2
ELECTRODE REM PT RTRN 9FT ADLT (ELECTROSURGICAL) ×1 IMPLANT
GAUZE SPONGE 4X4 12PLY STRL (GAUZE/BANDAGES/DRESSINGS) ×2 IMPLANT
GLOVE SURG ENC TEXT LTX SZ8 (GLOVE) ×2 IMPLANT
GLOVE SURG MICRO LTX SZ8 (GLOVE) ×2 IMPLANT
GOWN STRL REUS W/ TWL LRG LVL3 (GOWN DISPOSABLE) ×3 IMPLANT
GOWN STRL REUS W/ TWL XL LVL3 (GOWN DISPOSABLE) ×3 IMPLANT
GOWN STRL REUS W/TWL LRG LVL3 (GOWN DISPOSABLE) ×6
GOWN STRL REUS W/TWL XL LVL3 (GOWN DISPOSABLE) ×6
KIT BASIN OR (CUSTOM PROCEDURE TRAY) ×2 IMPLANT
KIT TURNOVER KIT B (KITS) ×2 IMPLANT
LOOP VESSEL MAXI BLUE (MISCELLANEOUS) ×2 IMPLANT
MANIFOLD NEPTUNE II (INSTRUMENTS) ×2 IMPLANT
NDL 1/2 CIR CATGUT .05X1.09 (NEEDLE) ×1 IMPLANT
NDL HYPO 25GX1X1/2 BEV (NEEDLE) ×1 IMPLANT
NEEDLE 1/2 CIR CATGUT .05X1.09 (NEEDLE) ×2 IMPLANT
NEEDLE HYPO 25GX1X1/2 BEV (NEEDLE) ×2 IMPLANT
NS IRRIG 1000ML POUR BTL (IV SOLUTION) ×2 IMPLANT
PACK ORTHO EXTREMITY (CUSTOM PROCEDURE TRAY) ×2 IMPLANT
PAD ARMBOARD 7.5X6 YLW CONV (MISCELLANEOUS) ×4 IMPLANT
PAD CAST 4YDX4 CTTN HI CHSV (CAST SUPPLIES) ×2 IMPLANT
PADDING CAST COTTON 4X4 STRL (CAST SUPPLIES) ×2
SLING ARM FOAM STRAP XLG (SOFTGOODS) ×1 IMPLANT
SOL PREP POV-IOD 4OZ 10% (MISCELLANEOUS) ×3 IMPLANT
SPLINT FIBERGLASS 3X35 (CAST SUPPLIES) ×1 IMPLANT
SPONGE T-LAP 4X18 ~~LOC~~+RFID (SPONGE) ×2 IMPLANT
STRIP CLOSURE SKIN 1/2X4 (GAUZE/BANDAGES/DRESSINGS) ×3 IMPLANT
SUT FIBERWIRE #2 38 T-5 BLUE (SUTURE) ×2
SUT FIBERWIRE 2-0 18 17.9 3/8 (SUTURE) ×2
SUT PROLENE 4 0 PS 2 18 (SUTURE) ×2 IMPLANT
SUT VIC AB 3-0 FS2 27 (SUTURE) ×2 IMPLANT
SUTURE FIBERWR #2 38 T-5 BLUE (SUTURE) ×1 IMPLANT
SUTURE FIBERWR 2-0 18 17.9 3/8 (SUTURE) ×1 IMPLANT
SYR CONTROL 10ML LL (SYRINGE) IMPLANT
SYSTEM ARTHRO FOR DISTAL BICEP (Orthopedic Implant) ×1 IMPLANT
SYSTEM CHEST DRAIN TLS 7FR (DRAIN) IMPLANT
TOWEL GREEN STERILE (TOWEL DISPOSABLE) ×2 IMPLANT
TOWEL GREEN STERILE FF (TOWEL DISPOSABLE) ×4 IMPLANT
TUBE CONNECTING 12X1/4 (SUCTIONS) ×2 IMPLANT
TUBE EVACUATION TLS (MISCELLANEOUS) IMPLANT
UNDERPAD 30X36 HEAVY ABSORB (UNDERPADS AND DIAPERS) ×2 IMPLANT
WATER STERILE IRR 1000ML POUR (IV SOLUTION) ×1 IMPLANT
YANKAUER SUCT BULB TIP NO VENT (SUCTIONS) ×2 IMPLANT

## 2021-05-27 NOTE — Discharge Instructions (Addendum)
Orthopaedic Hand Surgery Discharge Instructions  WEIGHT BEARING STATUS: Non weight bearing on operative extremity  DRESSINGS: Please keep your dressing/splint/cast clean and dry until your follow-up appointment. You may shower by placing a waterproof covering over your dressing/splint/cast. Contact your surgeon if your splint/cast gets wet. It will need to be changed to prevent skin breakdown.  PAIN CONTROL: First line medications for post operative pain control are Tylenol (acetaminophen) and Motrin (ibuprofen) if you are able to take these medications. If you have been prescribed a medication these can be taken as breakthrough pain medications. Please note that some narcotic pain medication have acetaminophen added and you should never consume more than 4,000mg  of acetaminophen in 24 hour period. Also please note that if you are given Toradol (ketoralac) you should not take similar medications simultaneously such as ibuprofen.   ICE/ELEVATION: Ice and elevate your injured extremity as needed. Avoid direct contact of ice with skin.  HOME MEDICATIONS: No changes have been made to your home medications.  FOLLOW UP: You will be called after surgery with an appointment date and time, however if you have not received a phone call within 3 days please call during regular office hours at [phone] to schedule a post operative appointment.  Please Seek Medical Attention if: Call MD for: pain or pressure in chest, jaw, arm, back, neck  Call MD for: temperature greater than 101 F for more than 24 hours  Call MD for: difficulty breathing Call MD for: Incision redness, bleeding, drainage  Call MD for: palpitations or feeling that the heart is racing  Call MD for: increased swelling in arm, leg, ankle, or abdomen  Call MD for: lightheadedness, dizziness, fainting Go to ED or call 911 if: chest pain does not go away after 3 nitroglycerin doses taken 5 min apart  Go to ED or call 911 for: any  uncontrolled bleeding  Go to ED or call 911 if: unable to reach physician  Discharge Medications: Percocet sent through [facility].    Matt Holmes, MD Orthopaedic Hand Surgery

## 2021-05-27 NOTE — Interval H&P Note (Signed)
History and Physical Interval Note:  05/27/2021 3:48 PM  Douglas Cole  has presented today for surgery, with the diagnosis of Right elbow distal bicep rupture.  The various methods of treatment have been discussed with the patient and family. After consideration of risks, benefits and other options for treatment, the patient has consented to  Procedure(s): Right elbow distal biceps repair, possible allograft reconstruction (Right) as a surgical intervention.  The patient's history has been reviewed, patient examined, no change in status, stable for surgery.  I have reviewed the patient's chart and labs.  Questions were answered to the patient's satisfaction.     [name]

## 2021-05-27 NOTE — Anesthesia Procedure Notes (Signed)
Anesthesia Regional Block: Supraclavicular block   Pre-Anesthetic Checklist: , timeout performed,  Correct Patient, Correct Site, Correct Laterality,  Correct Procedure, Correct Position, site marked,  Risks and benefits discussed,  Surgical consent,  Pre-op evaluation,  At surgeon's request and post-op pain management  Laterality: Right  Prep: Maximum Sterile Barrier Precautions used, chloraprep       Needles:  Injection technique: Single-shot  Needle Type: Echogenic Stimulator Needle     Needle Length: 9cm  Needle Gauge: 22     Additional Needles:   Procedures:,,,, ultrasound used (permanent image in chart),,    Narrative:  Start time: 05/27/2021 4:20 PM End time: 05/27/2021 4:26 PM Injection made incrementally with aspirations every 5 mL.  Performed by: Personally  Anesthesiologist: Freddrick March, MD  Additional Notes: Monitors applied. No increased pain on injection. No increased resistance to injection. Injection made in 5cc increments. Good needle visualization. Patient tolerated procedure well.

## 2021-05-27 NOTE — Transfer of Care (Signed)
Immediate Anesthesia Transfer of Care Note  Patient: SANG BLOUNT  Procedure(s) Performed: Right elbow distal biceps repair, possible allograft reconstruction (Right)  Patient Location: PACU  Anesthesia Type:MAC combined with regional for post-op pain  Level of Consciousness: drowsy and patient cooperative  Airway & Oxygen Therapy: Patient Spontanous Breathing and Patient connected to nasal cannula oxygen  Post-op Assessment: Report given to RN, Post -op Vital signs reviewed and stable and Patient moving all extremities  Post vital signs: Reviewed and stable  Last Vitals:  Vitals Value Taken Time  BP 118/80 05/27/21 1859  Temp    Pulse 61 05/27/21 1901  Resp 15 05/27/21 1901  SpO2 98 % 05/27/21 1901  Vitals shown include unvalidated device data.  Last Pain:  Vitals:   05/27/21 1626  TempSrc:   PainSc: 0-No pain      Patients Stated Pain Goal: 0 (43/88/87 [id number])  Complications: No notable events documented.

## 2021-05-27 NOTE — Anesthesia Procedure Notes (Signed)
Procedure Name: MAC Date/Time: 05/27/2021 5:40 PM Performed by: Moshe Salisbury, CRNA Pre-anesthesia Checklist: Patient identified, Emergency Drugs available, Suction available and Patient being monitored Patient Re-evaluated:Patient Re-evaluated prior to induction Oxygen Delivery Method: Nasal cannula Placement Confirmation: positive ETCO2 Dental Injury: Teeth and Oropharynx as per pre-operative assessment

## 2021-05-27 NOTE — Op Note (Signed)
OPERATIVE NOTE  DATE OF PROCEDURE: 05/27/2021  SURGEONS:  Primary: [name], MD Assisting: Iran Planas, MD  PREOPERATIVE DIAGNOSIS: Right elbow distal bicep rupture  POSTOPERATIVE DIAGNOSIS: Same  NAME OF PROCEDURE: Right elbow distal biceps repair  ANESTHESIA: Monitor Anesthesia Care + Regional Nerve Block  SKIN PREPARATION: Hibiclens  ESTIMATED BLOOD LOSS: 10 cc  IMPLANTS: Arthrex Distal Biceps Endobutton  INDICATIONS:  Douglas Cole is a 60 y.o. male who has the above preoperative diagnosis. The patient has decided to proceed with surgical intervention.  Risks, benefits and alternatives of operative management were discussed including, but not limited to, risks of anesthesia complications, infection, pain, persistent symptoms, stiffness, need for future surgery.  The patient understands, agrees and elects to proceed with surgery.    DESCRIPTION OF PROCEDURE: The patient was met in the pre-operative area and their identity was verified.  The operative location and laterality was also verified and marked.  The patient was brought to the OR and was placed supine on the table.  After repeat patient identification with the operative team anesthesia was provided and the patient was prepped and draped in the usual sterile fashion.  A final timeout was performed verifying the correction patient, procedure, location and laterality.  Preoperative antibiotics were provided and the surgery began by making a longitudinal incision centered over the radial tuberosity in the proximal forearm.   Skin and subcutaneous tissues were divided and careful hemostasis was obtained.  Branches of the lateral antebrachial cutaneous nerve were protected and the antebrachial fascia was entered via blunt dissection.  The branches of the radial recurrent artery were ligated and careful hemostasis was obtained throughout approach to the radial tuberosity.  This was identified and retractors were placed taking care to  protect the  posterior lateral aspect of the proximal radius to protect the posterior interosseous nerve.  At this time the biceps tendon was identified via blunt dissection and delivered through the wound.  The distal aspect of the tendon was trimmed free of scar tissue and to the appropriate shape and a 2-0 FiberWire whipstitch was placed.  At this time the Endobutton was loaded and the guidepin was centered on the radial tuberosity.  The guidepin was placed in a unicortical fashion.   Thorough irrigation with normal saline and suction was utilized to remove all bone reamings. The guidepin was checked on x-ray to confirm adequate placement within the radial tuberosity at the center of the anatomic footprint of the biceps tendon and the Endobutton was deployed in a unicortical fashion.  This was then flipped and a tension slide technique was utilized in order to bring the distal biceps tendon stump down to the radial tuberosity footprint.  A free needle was utilized to bring 1 limb of the suture through the distal aspect of the tendon and then a series of square knots were utilized to secure the distal biceps into the radial tuberosity with alternating posts.  Hemostasis was again meticulously obtained.  The wound was thoroughly irrigated and the wound was closed with 3-0 Monocryl sutures followed by Dermabond and Steri-Strips.   A sterile soft dressing was applied followed by a long-arm splint. The hand remained pink and warm and well-perfused at the end of the case.  Tourniquet was not utilized for this case.  All counts were correct x2.  The patient tolerated the procedure well and was brought to the postanesthesia care unit in stable condition.   Matt Holmes, MD

## 2021-05-27 NOTE — Anesthesia Preprocedure Evaluation (Addendum)
Anesthesia Evaluation  Patient identified by MRN, date of birth, ID band Patient awake    Reviewed: Allergy & Precautions, NPO status , Patient's Chart, lab work & pertinent test results  Airway Mallampati: I  TM Distance: >3 FB Neck ROM: Full    Dental no notable dental hx. (+) Teeth Intact, Dental Advisory Given   Pulmonary neg pulmonary ROS,    Pulmonary exam normal breath sounds clear to auscultation       Cardiovascular negative cardio ROS Normal cardiovascular exam Rhythm:Regular Rate:Normal     Neuro/Psych  Headaches, negative psych ROS   GI/Hepatic Neg liver ROS, GERD  ,  Endo/Other  negative endocrine ROS  Renal/GU negative Renal ROS  negative genitourinary   Musculoskeletal negative musculoskeletal ROS (+)   Abdominal   Peds  Hematology negative hematology ROS (+)   Anesthesia Other Findings   Reproductive/Obstetrics                            Anesthesia Physical Anesthesia Plan  ASA: 2  Anesthesia Plan: MAC and Regional   Post-op Pain Management:  Regional for Post-op pain   Induction: Intravenous  PONV Risk Score and Plan: 1 and Propofol infusion, Treatment may vary due to age or medical condition, Midazolam, Ondansetron and Dexamethasone  Airway Management Planned: Natural Airway  Additional Equipment:   Intra-op Plan:   Post-operative Plan:   Informed Consent: I have reviewed the patients History and Physical, chart, labs and discussed the procedure including the risks, benefits and alternatives for the proposed anesthesia with the patient or authorized representative who has indicated his/her understanding and acceptance.     Dental advisory given  Plan Discussed with: CRNA  Anesthesia Plan Comments:         Anesthesia Quick Evaluation

## 2021-05-28 ENCOUNTER — Encounter (HOSPITAL_COMMUNITY): Payer: Self-pay | Admitting: Orthopedic Surgery

## 2021-05-29 ENCOUNTER — Encounter (HOSPITAL_COMMUNITY): Payer: Self-pay | Admitting: Orthopedic Surgery

## 2021-05-29 NOTE — Anesthesia Postprocedure Evaluation (Signed)
Anesthesia Post Note  Patient: Douglas Cole  Procedure(s) Performed: Right elbow distal biceps repair, possible allograft reconstruction (Right)     Patient location during evaluation: PACU Anesthesia Type: Regional Level of consciousness: awake and alert Pain management: pain level controlled Vital Signs Assessment: post-procedure vital signs reviewed and stable Respiratory status: spontaneous breathing, nonlabored ventilation and respiratory function stable Cardiovascular status: stable and blood pressure returned to baseline Anesthetic complications: no   No notable events documented.  Last Vitals:  Vitals:   05/27/21 1915 05/27/21 1930  BP: 123/80 126/84  Pulse: (!) 59 62  Resp: 16 18  Temp:    SpO2: 97% 97%    Last Pain:  Vitals:   05/27/21 1930  TempSrc:   PainSc: 0-No pain                 Audry Pili

## 2021-05-30 LAB — POCT I-STAT, CHEM 8
BUN: 15 mg/dL (ref 8–23)
Calcium, Ion: 0.93 mmol/L — ABNORMAL LOW (ref 1.15–1.40)
Chloride: 106 mmol/L (ref 98–111)
Creatinine, Ser: 0.8 mg/dL (ref 0.61–1.24)
Glucose, Bld: 89 mg/dL (ref 70–99)
HCT: 44 % (ref 39.0–52.0)
Hemoglobin: 15 g/dL (ref 13.0–17.0)
Potassium: 3.7 mmol/L (ref 3.5–5.1)
Sodium: 138 mmol/L (ref 135–145)
TCO2: 23 mmol/L (ref 22–32)

## 2021-06-15 ENCOUNTER — Encounter (HOSPITAL_COMMUNITY): Payer: Self-pay | Admitting: Orthopedic Surgery

## 2022-05-13 ENCOUNTER — Other Ambulatory Visit: Payer: Self-pay | Admitting: Urology

## 2022-05-13 DIAGNOSIS — C61 Malignant neoplasm of prostate: Secondary | ICD-10-CM

## 2022-11-11 ENCOUNTER — Ambulatory Visit: Payer: Self-pay | Admitting: Cardiology

## 2022-12-07 ENCOUNTER — Other Ambulatory Visit: Payer: Self-pay | Admitting: Urology

## 2022-12-07 DIAGNOSIS — C61 Malignant neoplasm of prostate: Secondary | ICD-10-CM

## 2023-03-09 ENCOUNTER — Encounter: Payer: Self-pay | Admitting: Urology

## 2023-03-11 ENCOUNTER — Ambulatory Visit
Admission: RE | Admit: 2023-03-11 | Discharge: 2023-03-11 | Disposition: A | Discharge status: Home / Self Care | Payer: BC Managed Care – PPO | Source: Ambulatory Visit | Attending: Urology | Admitting: Urology

## 2023-03-11 DIAGNOSIS — C61 Malignant neoplasm of prostate: Secondary | ICD-10-CM

## 2023-03-11 MED ORDER — GADOPICLENOL 0.5 MMOL/ML IV SOLN
8.0000 mL | Freq: Once | INTRAVENOUS | Status: AC | PRN
  Administered 2023-03-11: 8 mL via INTRAVENOUS

## 2023-05-24 LAB — LAB REPORT - SCANNED
A1c: 5.4
EGFR: 97

## 2023-05-26 ENCOUNTER — Other Ambulatory Visit: Payer: Self-pay | Admitting: Family Medicine

## 2023-05-26 DIAGNOSIS — I712 Thoracic aortic aneurysm, without rupture, unspecified: Secondary | ICD-10-CM

## 2023-06-16 ENCOUNTER — Ambulatory Visit
Admission: RE | Admit: 2023-06-16 | Discharge: 2023-06-16 | Disposition: A | Discharge status: Home / Self Care | Payer: BC Managed Care – PPO | Source: Ambulatory Visit | Attending: Family Medicine | Admitting: Family Medicine

## 2023-06-16 DIAGNOSIS — I712 Thoracic aortic aneurysm, without rupture, unspecified: Secondary | ICD-10-CM

## 2023-06-16 MED ORDER — IOPAMIDOL (ISOVUE-370) INJECTION 76%
200.0000 mL | Freq: Once | INTRAVENOUS | Status: AC | PRN
  Administered 2023-06-16: 75 mL via INTRAVENOUS

## 2023-08-01 ENCOUNTER — Institutional Professional Consult (permissible substitution): Payer: BC Managed Care – PPO | Admitting: Surgical

## 2023-08-01 VITALS — BP 157/94 | HR 94 | Resp 18 | Ht 67.0 in | Wt 175.0 lb

## 2023-08-01 DIAGNOSIS — I7121 Aneurysm of the ascending aorta, without rupture: Secondary | ICD-10-CM

## 2023-08-01 NOTE — Progress Notes (Signed)
[address]       [place] [address]             [phone]        Douglas Cole [id number] 02-Apr-1961   History of Present Illness patient is a 62 year old male seen in the office on today's date for evaluation of 4.5 cm ascending thoracic aortic aneurysm found on recent CT scan done last October.  This was done while evaluating difficulty urinating with a known history of prostate cancer.  He feels well overall.  He does give occasional reflux/heartburn.  Has occasional constipation but otherwise his review of symptoms is pretty unremarkable.  No known family members with genetic predisposition to aortic aneurysm disease.  He currently works as a Education administrator for [employer].     Past Medical History:  Diagnosis Date   Cancer [facility])    prostate   Elevated liver enzymes last week   GERD (gastroesophageal reflux disease)    ocassional   Headache    Heart murmur    Past Surgical History:  Procedure Laterality Date   CHOLECYSTECTOMY  05/28/11    lap chole    DISTAL BICEPS TENDON REPAIR Right 05/27/2021   Procedure: Right elbow distal biceps repair, possible allograft reconstruction;  Surgeon: Gomez Cleverly, MD;  Location: [facility] OR;  Service: Orthopedics;  Laterality: Right;   ESOPHAGOGASTRODUODENOSCOPY N/A 12/21/2013   Procedure: ESOPHAGOGASTRODUODENOSCOPY (EGD);  Surgeon: Vertell Novak., MD;  Location: [facility];  Service: Endoscopy;  Laterality: N/A;   EUS N/A 12/04/2014   Procedure: ESOPHAGEAL ENDOSCOPIC ULTRASOUND (EUS) RADIAL;  Surgeon: Willis Modena, MD;  Location: [facility];  Service: Endoscopy;  Laterality: N/A;   No current outpatient medications   Allergies  Allergen Reactions   Penicillins Rash    Reaction: 30 years     Social History   Occupational History   Not on file  Tobacco Use   Smoking status: Never   Smokeless tobacco: Never  Vaping Use   Vaping status: Never Used  Substance and Sexual Activity   Alcohol use:  Yes    Alcohol/week: 3.0 standard drinks of alcohol    Types: 3 Cans of beer per week    Comment: occasional   Drug use: No   Sexual activity: Not on file     BP (!) 157/94 (BP Location: Left Arm, Patient Position: Sitting)   Pulse 94   Resp 18   Ht 5\' 7"  (1.702 m)   Wt 175 lb (79.4 kg)   SpO2 98% Comment: RA  BMI 27.41 kg/m   Physical Exam Constitutional:      General: He is not in acute distress.    Appearance: He is normal weight. He is not ill-appearing.  HENT:     Head: Normocephalic and atraumatic.  Eyes:     General: No scleral icterus. Neck:     Comments: Likely transmitted aortic murmur Cardiovascular:     Rate and Rhythm: Normal rate.     Pulses: Normal pulses.     Heart sounds: Murmur heard.     Comments: 4/6 systolic ejection murmur Pulmonary:     Effort: Pulmonary effort is normal.     Breath sounds: Normal breath sounds.  Abdominal:     General: Abdomen is flat.     Palpations: Abdomen is soft.  Skin:    Coloration: Skin is not jaundiced.     Findings: No lesion or rash.     Comments: ? mild  clubbing  Neurological:     General: No focal deficit present.     Mental Status: He is alert.  Psychiatric:        Thought Content: Thought content normal.     CTA Results: Narrative & Impression  CLINICAL DATA:  Aneurysm.  History of prostate cancer.   EXAM: CT ANGIOGRAPHY CHEST WITH CONTRAST   TECHNIQUE: Multidetector CT imaging of the chest was performed using the standard protocol during bolus administration of intravenous contrast. Multiplanar CT image reconstructions and MIPs were obtained to evaluate the vascular anatomy.   RADIATION DOSE REDUCTION: This exam was performed according to the departmental dose-optimization program which includes automated exposure control, adjustment of the mA and/or kV according to patient size and/or use of iterative reconstruction technique.   CONTRAST:  75mL ISOVUE-370 IOPAMIDOL (ISOVUE-370) INJECTION  76%   COMPARISON:  10/16/2022.   FINDINGS: Cardiovascular: The heart is normal in size and there is no pericardial effusion. Calcifications are noted at the aortic valve. There is aneurysmal dilatation of the ascending aorta measuring 4.5 cm. No dissection is seen. Pulmonary trunk is normal in caliber.   Mediastinum/Nodes: No enlarged mediastinal, hilar, or axillary lymph nodes. Thyroid gland, trachea, and esophagus demonstrate no significant findings.   Lungs/Pleura: Lungs are clear. No pleural effusion or pneumothorax.   Upper Abdomen: Pneumobilia is noted. The gallbladder is surgically absent. No acute abnormality.   Musculoskeletal: Mild degenerative changes are present in the thoracic spine. No acute osseous abnormality.   Review of the MIP images confirms the above findings.   IMPRESSION: Calcification of the aortic valve with aneurysmal dilatation of the ascending aorta measuring 4.5 cm. Ascending thoracic aortic aneurysm. Recommend semi-annual imaging followup by CTA or MRA and referral to cardiothoracic surgery if not already obtained. This recommendation follows 2010 ACCF/AHA/AATS/ACR/ASA/SCA/SCAI/SIR/STS/SVM Guidelines for the Diagnosis and Management of Patients With Thoracic Aortic Disease. Circulation. 2010; 121: X381-W299. Aortic aneurysm NOS (ICD10-I71.9)     Electronically Signed   By: Thornell Sartorius M.D.   On: 06/24/2023 04:01       A/P: 4.5 cm ascending thoracic aortic aneurysm found incidentally.  He is basically asymptomatic in this regard.  As he has a very loud aortic murmur, I feel those it would be prudent to get cardiology involved at this time.  He could potentially be bicuspid as he noted it was first observed as a teenager.  He will definitely require an echocardiogram.  In terms of the aneurysm some of the future planning will depend on valvular findings.  At this time we will plan for 43-month follow-up chest CTA.  He was noted to have  significantly higher blood pressure than normal for him on today's office visit.  He does have a blood pressure monitor at home and will start a diary.  He has no previous history of hypertension.      Risk Modification: Discussed activity and lifestyle modification  Statin:  no  Smoking cessation instruction/counseling given:  non smoker  Patient was counseled on importance of Blood Pressure Control.  Despite Medical intervention if the patient notices persistently elevated blood pressure readings.  They are instructed to contact their Primary Care Physician  Please avoid use of Fluoroquinolones as this can potentially increase your risk of Aortic Rupture and/or Dissection  Patient educated on signs and symptoms of Aortic Dissection, handout also provided in AVS  Rowe Clack, PA-C 08/01/23

## 2023-08-01 NOTE — Patient Instructions (Signed)
Lifestyle modifications as discussed

## 2023-08-11 ENCOUNTER — Other Ambulatory Visit: Payer: Self-pay

## 2023-08-11 DIAGNOSIS — R519 Headache, unspecified: Secondary | ICD-10-CM | POA: Insufficient documentation

## 2023-08-11 DIAGNOSIS — R748 Abnormal levels of other serum enzymes: Secondary | ICD-10-CM | POA: Insufficient documentation

## 2023-08-11 DIAGNOSIS — R011 Cardiac murmur, unspecified: Secondary | ICD-10-CM | POA: Insufficient documentation

## 2023-08-11 DIAGNOSIS — C801 Malignant (primary) neoplasm, unspecified: Secondary | ICD-10-CM | POA: Insufficient documentation

## 2023-08-18 ENCOUNTER — Ambulatory Visit: Payer: BC Managed Care – PPO | Attending: Cardiology | Admitting: Cardiology

## 2023-08-18 ENCOUNTER — Encounter: Payer: Self-pay | Admitting: Cardiology

## 2023-08-18 VITALS — BP 136/78 | HR 101 | Ht 67.0 in | Wt 173.2 lb

## 2023-08-18 DIAGNOSIS — E7800 Pure hypercholesterolemia, unspecified: Secondary | ICD-10-CM

## 2023-08-18 DIAGNOSIS — I7121 Aneurysm of the ascending aorta, without rupture: Secondary | ICD-10-CM

## 2023-08-18 DIAGNOSIS — R000 Tachycardia, unspecified: Secondary | ICD-10-CM

## 2023-08-18 DIAGNOSIS — R011 Cardiac murmur, unspecified: Secondary | ICD-10-CM | POA: Diagnosis not present

## 2023-08-18 HISTORY — DX: Aneurysm of the ascending aorta, without rupture: I71.21

## 2023-08-18 NOTE — Patient Instructions (Signed)
Medication Instructions:  Your physician recommends that you continue on your current medications as directed. Please refer to the Current Medication list given to you today.  *If you need a refill on your cardiac medications before your next appointment, please call your pharmacy*   Lab Work: None ordered If you have labs (blood work) drawn today and your tests are completely normal, you will receive your results only by: MyChart Message (if you have MyChart) OR A paper copy in the mail If you have any lab test that is abnormal or we need to change your treatment, we will call you to review the results.   Testing/Procedures: Your physician has requested that you have an echocardiogram. Echocardiography is a painless test that uses sound waves to create images of your heart. It provides your doctor with information about the size and shape of your heart and how well your heart's chambers and valves are working. This procedure takes approximately one hour. There are no restrictions for this procedure. Please do NOT wear cologne, perfume, aftershave, or lotions (deodorant is allowed). Please arrive 15 minutes prior to your appointment time.  We will order CT coronary calcium score. It will cost $99.00 and is due at time of scan.  Please call to schedule.    [facility] [address] [place], [place] [address] Located on the first floor, [address] [phone]  Follow-Up: At [facility], you and your health needs are our priority.  As part of our continuing mission to provide you with exceptional heart care, we have created designated Provider Care Teams.  These Care Teams include your primary Cardiologist (physician) and Advanced Practice Providers (APPs -  Physician Assistants and Nurse Practitioners) who all work together to provide you with the care you need, when you need it.  We recommend signing up for the patient portal called "MyChart".  Sign up information is  provided on this After Visit Summary.  MyChart is used to connect with patients for Virtual Visits (Telemedicine).  Patients are able to view lab/test results, encounter notes, upcoming appointments, etc.  Non-urgent messages can be sent to your provider as well.   To learn more about what you can do with MyChart, go to [email or web address].    Your next appointment:   9 month(s)  The format for your next appointment:   In Person  Provider:   Belva Crome, MD   Other Instructions Echocardiogram An echocardiogram is a test that uses sound waves (ultrasound) to produce images of the heart. Images from an echocardiogram can provide important information about: Heart size and shape. The size and thickness and movement of your heart's walls. Heart muscle function and strength. Heart valve function or if you have stenosis. Stenosis is when the heart valves are too narrow. If blood is flowing backward through the heart valves (regurgitation). A tumor or infectious growth around the heart valves. Areas of heart muscle that are not working well because of poor blood flow or injury from a heart attack. Aneurysm detection. An aneurysm is a weak or damaged part of an artery wall. The wall bulges out from the normal force of blood pumping through the body. Tell a health care provider about: Any allergies you have. All medicines you are taking, including vitamins, herbs, eye drops, creams, and over-the-counter medicines. Any blood disorders you have. Any surgeries you have had. Any medical conditions you have. Whether you are pregnant or may be pregnant. What are the risks? Generally, this is  a safe test. However, problems may occur, including an allergic reaction to dye (contrast) that may be used during the test. What happens before the test? No specific preparation is needed. You may eat and drink normally. What happens during the test? You will take off your clothes from the waist up  and put on a hospital gown. Electrodes or electrocardiogram (ECG)patches may be placed on your chest. The electrodes or patches are then connected to a device that monitors your heart rate and rhythm. You will lie down on a table for an ultrasound exam. A gel will be applied to your chest to help sound waves pass through your skin. A handheld device, called a transducer, will be pressed against your chest and moved over your heart. The transducer produces sound waves that travel to your heart and bounce back (or "echo" back) to the transducer. These sound waves will be captured in real-time and changed into images of your heart that can be viewed on a video monitor. The images will be recorded on a computer and reviewed by your health care provider. You may be asked to change positions or hold your breath for a short time. This makes it easier to get different views or better views of your heart. In some cases, you may receive contrast through an IV in one of your veins. This can improve the quality of the pictures from your heart. The procedure may vary among health care providers and hospitals.   What can I expect after the test? You may return to your normal, everyday life, including diet, activities, and medicines, unless your health care provider tells you not to do that. Follow these instructions at home: It is up to you to get the results of your test. Ask your health care provider, or the department that is doing the test, when your results will be ready. Keep all follow-up visits. This is important. Summary An echocardiogram is a test that uses sound waves (ultrasound) to produce images of the heart. Images from an echocardiogram can provide important information about the size and shape of your heart, heart muscle function, heart valve function, and other possible heart problems. You do not need to do anything to prepare before this test. You may eat and drink normally. After the  echocardiogram is completed, you may return to your normal, everyday life, unless your health care provider tells you not to do that. This information is not intended to replace advice given to you by your health care provider. Make sure you discuss any questions you have with your health care provider. Document Revised: 04/08/2020 Document Reviewed: 04/08/2020 Elsevier Patient Education  2021 Elsevier Inc.  Coronary Calcium Scan A coronary calcium scan is an imaging test used to look for deposits of plaque in the inner lining of the blood vessels of the heart (coronary arteries). Plaque is made up of calcium, protein, and fatty substances. These deposits of plaque can partly clog and narrow the coronary arteries without producing any symptoms or warning signs. This puts a person at risk for a heart attack. A coronary calcium scan is performed using a computed tomography (CT) scanner machine without using a dye (contrast). This test is recommended for people who are at moderate risk for heart disease. The test can find plaque deposits before symptoms develop. Tell a health care provider about: Any allergies you have. All medicines you are taking, including vitamins, herbs, eye drops, creams, and over-the-counter medicines. Any problems you or family members have  had with anesthetic medicines. Any bleeding problems you have. Any surgeries you have had. Any medical conditions you have. Whether you are pregnant or may be pregnant. What are the risks? Generally, this is a safe procedure. However, problems may occur, including: Harm to a pregnant woman and her unborn baby. This test involves the use of radiation. Radiation exposure can be dangerous to a pregnant woman and her unborn baby. If you are pregnant or think you may be pregnant, you should not have this procedure done. A slight increase in the risk of cancer. This is because of the radiation involved in the test. The amount of radiation from  one test is similar to the amount of radiation you are naturally exposed to over one year. What happens before the procedure? Ask your health care provider for any specific instructions on how to prepare for this procedure. You may be asked to avoid products that contain caffeine, tobacco, or nicotine for 4 hours before the procedure. What happens during the procedure?  You will undress and remove any jewelry from your neck or chest. You may need to remove hearing aides and dentures. Women may need to remove their bras. You will put on a hospital gown. Sticky electrodes will be placed on your chest. The electrodes will be connected to an electrocardiogram (ECG) machine to record a tracing of the electrical activity of your heart. You will lie down on your back on a curved bed that is attached to the CT scanner. You may be given medicine to slow down your heart rate so that clear pictures can be created. You will be moved into the CT scanner, and the CT scanner will take pictures of your heart. During this time, you will be asked to lie still and hold your breath for 10-20 seconds at a time while each picture of your heart is being taken. The procedure may vary among health care providers and hospitals. What can I expect after the procedure? You can return to your normal activities. It is up to you to get the results of your procedure. Ask your health care provider, or the department that is doing the procedure, when your results will be ready. Summary A coronary calcium scan is an imaging test used to look for deposits of plaque in the inner lining of the blood vessels of the heart. Plaque is made up of calcium, protein, and fatty substances. A coronary calcium scan is performed using a CT scanner machine without contrast. Generally, this is a safe procedure. Tell your health care provider if you are pregnant or may be pregnant. Ask your health care provider for any specific instructions on how to  prepare for this procedure. You can return to your normal activities after the scan is done. This information is not intended to replace advice given to you by your health care provider. Make sure you discuss any questions you have with your health care provider. Document Revised: 07/26/2021 Document Reviewed: 07/26/2021 Elsevier Patient Education  2024 Elsevier Inc.   Important Information About Sugar

## 2023-08-18 NOTE — Progress Notes (Signed)
Cardiology Office Note:    Date:  08/18/2023   ID:  Douglas Cole, DOB [birth year], MRN [id number]  PCP:  Darrow Bussing, MD  Cardiologist:  Garwin Brothers, MD   Referring MD: Eugenio Hoes, MD    ASSESSMENT:    1. Tachycardia   2. 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 Heart murmur    PLAN:    In order of problems listed above:  Primary prevention stressed with the patient.  Importance of compliance with diet medication stressed and patient verbalized standing. Ascending aortic aneurysm: He has been monitored by our cardiothoracic colleagues.  He is going to go there for a follow-up CT in 6 months.  I discussed with him and educated him about symptoms of dissection.  He vocalized understanding questions were answered to his satisfaction. Cardiac murmur: Echocardiogram will be done to assess murmur heard on auscultation.  He has history of bicuspid aortic valve.  He has a significant murmur.  He has a significant murmur. Mixed dyslipidemia: I will get a calcium score to assess for lipid-lowering therapy.  He vocalized understanding.  There is no evidence of calcification on the previous CT scan that was done. Patient will be seen in follow-up appointment in 6 months or earlier if the patient has any concerns.    Medication Adjustments/Labs and Tests Ordered: Current medicines are reviewed at length with the patient today.  Concerns regarding medicines are outlined above.  Orders Placed This Encounter  Procedures   EKG 12-Lead   No orders of the defined types were placed in this encounter.    History of Present Illness:    Douglas Cole is a 62 y.o. male who is being seen today for the evaluation of ascending aortic aneurysm and cardiac murmur at the request of Eugenio Hoes, MD. patient is a pleasant 62 year old male.  He has been recently diagnosed to have ascending aortic aneurysm 4.5 cm.  He denies any chest pain orthopnea or PND.  He takes care of  activities of daily living.  He has a significant murmur for his history of foot.  He has history of bicuspid aortic valve is acute.  At the time of my evaluation, the patient is alert awake oriented and in no distress.  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   Elevated liver enzymes last week   Gastritis, acute 12/22/2013   GERD (gastroesophageal reflux disease)    ocassional   Headache    Heart murmur    Migraine 12/19/2013   Tachycardia 12/19/2013   Transaminitis 12/19/2013    Past Surgical History:  Procedure Laterality Date   CHOLECYSTECTOMY  05/28/11    lap chole    DISTAL BICEPS TENDON REPAIR Right 05/27/2021   Procedure: Right elbow distal biceps repair, possible allograft reconstruction;  Surgeon: Gomez Cleverly, MD;  Location: [facility] OR;  Service: Orthopedics;  Laterality: Right;   ESOPHAGOGASTRODUODENOSCOPY N/A 12/21/2013   Procedure: ESOPHAGOGASTRODUODENOSCOPY (EGD);  Surgeon: Vertell Novak., MD;  Location: [facility];  Service: Endoscopy;  Laterality: N/A;   EUS N/A 12/04/2014   Procedure: ESOPHAGEAL ENDOSCOPIC ULTRASOUND (EUS) RADIAL;  Surgeon: Willis Modena, MD;  Location: [facility];  Service: Endoscopy;  Laterality: N/A;    Current Medications: Current Meds  Medication Sig   tamsulosin (FLOMAX) 0.4 MG CAPS capsule Take 0.4 mg by mouth daily.     Allergies:  Penicillins   Social History   Socioeconomic History   Marital status: Married    Spouse name: Not on file   Number of children: Not on file   Years of education: Not on file   Highest education level: Not on file  Occupational History   Not on file  Tobacco Use   Smoking status: Never   Smokeless tobacco: Never  Vaping Use   Vaping status: Never Used  Substance and Sexual Activity   Alcohol use: Yes    Alcohol/week: 3.0 standard drinks of alcohol    Types: 3 Cans of beer per week    Comment:  occasional   Drug use: No   Sexual activity: Not on file  Other Topics Concern   Not on file  Social History Narrative   Not on file   Social Drivers of Health   Financial Resource Strain: Not on file  Food Insecurity: Not on file  Transportation Needs: Not on file  Physical Activity: Not on file  Stress: Not on file  Social Connections: Unknown (01/12/2022)   Received [facility]   Social Network    Social Network: Not on file     Family History: The patient's family history includes Cancer in his mother.  ROS:   Please see the history of present illness.    All other systems reviewed and are negative.  EKGs/Labs/Other Studies Reviewed:    The following studies were reviewed today:  .Marland Kitchen   Marland Kitchen.EKG Interpretation Date/Time:  Thursday August 18 2023 09:44:13 EST Ventricular Rate:  101 PR Interval:  140 QRS Duration:  76 QT Interval:  350 QTC Calculation: 453 R Axis:   -23  Text Interpretation: Sinus tachycardia Minimal voltage criteria for LVH, may be normal variant Borderline ECG When compared with ECG of 20-Dec-2013 00:30, PREVIOUS ECG IS PRESENT Confirmed by Belva Crome [id number]) on 08/18/2023 9:20:17 AM          Recent Labs: No results found for requested labs within last 365 days.  Recent Lipid Panel No results found for: "CHOL", "TRIG", "HDL", "CHOLHDL", "VLDL", "LDLCALC", "LDLDIRECT"  Physical Exam:    VS:  BP 136/78   Pulse (!) 101   Ht 5\' 7"  (1.702 m)   Wt 173 lb 3.2 oz (78.6 kg)   SpO2 98%   BMI 27.13 kg/m     Wt Readings from Last 3 Encounters:  08/18/23 173 lb 3.2 oz (78.6 kg)  08/01/23 175 lb (79.4 kg)  05/27/21 170 lb (77.1 kg)     GEN: Patient is in no acute distress HEENT: Normal NECK: No JVD; No carotid bruits LYMPHATICS: No lymphadenopathy CARDIAC: S1 S2 regular, 2/6 systolic murmur at the apex. RESPIRATORY:  Clear to auscultation without rales, wheezing or rhonchi  ABDOMEN: Soft, non-tender,  non-distended MUSCULOSKELETAL:  No edema; No deformity  SKIN: Warm and dry NEUROLOGIC:  Alert and oriented x 3 PSYCHIATRIC:  Normal affect    Signed, Garwin Brothers, MD  08/18/2023 9:19 AM    [facility]

## 2023-08-19 ENCOUNTER — Ambulatory Visit: Payer: BC Managed Care – PPO | Attending: Cardiology

## 2023-08-19 ENCOUNTER — Telehealth (HOSPITAL_BASED_OUTPATIENT_CLINIC_OR_DEPARTMENT_OTHER): Payer: Self-pay | Admitting: Cardiology

## 2023-08-19 DIAGNOSIS — I7121 Aneurysm of the ascending aorta, without rupture: Secondary | ICD-10-CM

## 2023-08-19 DIAGNOSIS — R011 Cardiac murmur, unspecified: Secondary | ICD-10-CM | POA: Diagnosis not present

## 2023-08-19 LAB — ECHOCARDIOGRAM COMPLETE
AR max vel: 0.81 cm2
AV Area VTI: 0.84 cm2
AV Area mean vel: 0.76 cm2
AV Mean grad: 45.4 mm[Hg]
AV Peak grad: 74.4 mm[Hg]
Ao pk vel: 4.31 m/s
Area-P 1/2: 4.52 cm2
S' Lateral: 2.9 cm

## 2023-08-25 ENCOUNTER — Ambulatory Visit (HOSPITAL_BASED_OUTPATIENT_CLINIC_OR_DEPARTMENT_OTHER)
Admission: RE | Admit: 2023-08-25 | Discharge: 2023-08-25 | Disposition: A | Discharge status: Home / Self Care | Payer: Self-pay | Source: Ambulatory Visit | Attending: Cardiology | Admitting: Cardiology

## 2023-08-25 DIAGNOSIS — E7800 Pure hypercholesterolemia, unspecified: Secondary | ICD-10-CM | POA: Insufficient documentation

## 2023-08-30 ENCOUNTER — Ambulatory Visit: Payer: BC Managed Care – PPO | Attending: Cardiology | Admitting: Cardiology

## 2023-08-30 ENCOUNTER — Encounter: Payer: Self-pay | Admitting: Cardiology

## 2023-08-30 VITALS — BP 158/80 | HR 110 | Ht 66.0 in | Wt 171.0 lb

## 2023-08-30 DIAGNOSIS — I7121 Aneurysm of the ascending aorta, without rupture: Secondary | ICD-10-CM

## 2023-08-30 DIAGNOSIS — I350 Nonrheumatic aortic (valve) stenosis: Secondary | ICD-10-CM | POA: Insufficient documentation

## 2023-08-30 DIAGNOSIS — I358 Other nonrheumatic aortic valve disorders: Secondary | ICD-10-CM | POA: Diagnosis not present

## 2023-08-30 NOTE — Patient Instructions (Signed)
 Medication Instructions:  Your physician recommends that you continue on your current medications as directed. Please refer to the Current Medication list given to you today.   *If you need a refill on your cardiac medications before your next appointment, please call your pharmacy*   Lab Work: Your physician recommends that you have a BMET and CBC today in the office for your upcoming procedure.  If you have labs (blood work) drawn today and your tests are completely normal, you will receive your results only by: MyChart Message (if you have MyChart) OR A paper copy in the mail If you have any lab test that is abnormal or we need to change your treatment, we will call you to review the results.   Testing/Procedures:       PATIENT INSTRUCTIONS         TRANSESOPHAGEAL ECHO (TEE)      HOSPITAL         [facility] A     [address]     Patient Name: Douglas Cole                         Diagnosis:    ICD-10-CM   1. 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  DO NOT EAT OR DRINK ANYTHING after midnight prior to your procedure.    You may take your medications with a sip of water.  If you have any questions, please call our office at [phone].    [facility] A DEPT OF [facility] [place] [address] [place] [address] Dept:  [phone] Loc: [phone]  Douglas Cole  08/30/2023  You are scheduled for a Cardiac Catheterization on Monday, January 6 with Dr.  Elmira .  1. Please arrive at the [facility] (Main Entrance A) at [facility]: [address] [place], [place] [address]. Free valet parking service is available. You will check in at ADMITTING. The support person will be asked to wait in the waiting room.  It is OK to have someone drop you off and come back when you are ready to be discharged.    Special note: Every effort is made to have your procedure done on time. Please understand that emergencies sometimes delay scheduled procedures.  2. Diet: Do not eat solid foods after midnight.  The patient may have clear liquids until 5am upon the day of the procedure.  3. Labs: You had your labs done in the office.  4. Medication instructions in preparation for your procedure:   Contrast Allergy: No  On the morning of your procedure, take your Aspirin  81 mg and any morning medicines NOT listed above.  You may use sips of water.  5. Plan to go home the same day, you will only stay overnight if medically necessary. 6. Bring a current list of your medications and current insurance cards. 7. You MUST have a responsible person to drive you home. 8. Someone MUST be with you the first 24 hours after you arrive home or your discharge will be delayed. 9. Please wear clothes that are easy to get on and off and wear slip-on shoes.  Thank you for allowing us  to care for you!   -- [facility]            Follow-Up: At [facility], you and your health needs are our priority.  As part of our continuing mission to provide you with exceptional heart care, we have created designated Provider Care Teams.  These Care Teams include your primary Cardiologist (physician) and Advanced Practice Providers (APPs -  Physician Assistants and Nurse Practitioners) who all work  together to provide you with the care you need, when you need it.  We recommend signing up for the patient portal called MyChart.  Sign up information is provided on this After Visit Summary.  MyChart is used to connect with patients for Virtual Visits (Telemedicine).  Patients are able to view lab/test results, encounter notes, upcoming appointments, etc.  Non-urgent messages can be sent to your provider as well.   To learn more about what you can do with MyChart, go to [email or web address].    Your next appointment:   6 month(s)  The format for your next appointment:   In Person  Provider:   Jennifer Crape, MD   Other Instructions  Coronary Angiogram With Stent Coronary angiogram with stent placement is a procedure to widen or open a narrow blood vessel of the heart (coronary artery). Arteries may become blocked by cholesterol buildup (plaques) in the lining of the artery wall. When a coronary artery becomes partially blocked, blood flow to that area decreases. This may lead to chest pain or a heart attack (myocardial infarction). A stent is a small piece of metal that looks like mesh or spring. Stent placement may be done as treatment after a heart attack, or to prevent a heart attack if a blocked artery is found by a coronary angiogram. Let your health care provider know about: Any allergies you have, including allergies to medicines or contrast dye. All medicines you are taking, including vitamins, herbs, eye drops, creams, and over-the-counter medicines. Any problems you or family members have had with anesthetic medicines. Any blood disorders you have. Any surgeries you have had. Any medical conditions you have, including kidney problems or kidney failure. Whether you are pregnant or may be pregnant. Whether you are breastfeeding. What are the risks? Generally, this is a safe procedure. However, serious problems may occur, including: Damage to nearby structures or organs,  such as the heart, blood vessels, or kidneys. A return of blockage. Bleeding, infection, or bruising at the insertion site. A collection of blood under the skin (hematoma) at the insertion site. A blood clot in another part of the body. Allergic reaction to medicines or dyes. Bleeding into the abdomen (retroperitoneal bleeding). Stroke (rare). Heart attack (rare). What happens before the procedure? Staying hydrated Follow instructions from your health care provider about hydration, which may include: Up to 2 hours before the procedure - you may continue to drink clear liquids, such as water, clear fruit juice, black coffee, and plain tea.    Eating  and drinking restrictions Follow instructions from your health care provider about eating and drinking, which may include: 8 hours before the procedure - stop eating heavy meals or foods, such as meat, fried foods, or fatty foods. 6 hours before the procedure - stop eating light meals or foods, such as toast or cereal. 2 hours before the procedure - stop drinking clear liquids. Medicines Ask your health care provider about: Changing or stopping your regular medicines. This is especially important if you are taking diabetes medicines or blood thinners. Taking medicines such as aspirin  and ibuprofen. These medicines can thin your blood. Do not take these medicines unless your health care provider tells you to take them. Generally, aspirin  is recommended before a thin tube, called a catheter, is passed through a blood vessel and inserted into the heart (cardiac catheterization). Taking over-the-counter medicines, vitamins, herbs, and supplements. General instructions Do not use any products that contain nicotine or tobacco for at least 4 weeks before the procedure. These products include cigarettes, e-cigarettes, and chewing tobacco. If you need help quitting, ask your health care provider. Plan to have someone take you home from the hospital or  clinic. If you will be going home right after the procedure, plan to have someone with you for 24 hours. You may have tests and imaging procedures. Ask your health care provider: How your insertion site will be marked. Ask which artery will be used for the procedure. What steps will be taken to help prevent infection. These may include: Removing hair at the insertion site. Washing skin with a germ-killing soap. Taking antibiotic medicine. What happens during the procedure? An IV will be inserted into one of your veins. Electrodes may be placed on your chest to monitor your heart rate during the procedure. You will be given one or more of the following: A medicine to help you relax (sedative). A medicine to numb the area (local anesthetic) for catheter insertion. A small incision will be made for catheter insertion. The catheter will be inserted into an artery using a guide wire. The location may be in your groin, your wrist, or the fold of your arm (near your elbow). An X-ray procedure (fluoroscopy) will be used to help guide the catheter to the opening of the heart arteries. A dye will be injected into the catheter. X-rays will be taken. The dye helps to show where any narrowing or blockages are located in the arteries. Tell your health care provider if you have chest pain or trouble breathing. A tiny wire will be guided to the blocked spot, and a balloon will be inflated to make the artery wider. The stent will be expanded to crush the plaques into the wall of the vessel. The stent will hold the area open and improve the blood flow. Most stents have a drug coating to reduce the risk of the stent narrowing over time. The artery may be made wider using a drill, laser, or other tools that remove plaques. The catheter will be removed when the blood flow improves. The stent will stay where it was placed, and the lining of the artery will grow over it. A bandage (dressing) will be placed on the  insertion site. Pressure will be applied to stop bleeding. The IV will be removed. This procedure may vary among health care providers and hospitals.    What happens after the procedure? Your blood pressure, heart rate, breathing rate, and blood oxygen level will be monitored until you leave the hospital or clinic.  If the procedure is done through the leg, you will lie flat in bed for a few hours or for as long as told by your health care provider. You will be instructed not to bend or cross your legs. The insertion site and the pulse in your foot or wrist will be checked often. You may have more blood tests, X-rays, and a test that records the electrical activity of your heart (electrocardiogram, or ECG). Do not drive for 24 hours if you were given a sedative during your procedure. Summary Coronary angiogram with stent placement is a procedure to widen or open a narrowed coronary artery. This is done to treat heart problems. Before the procedure, let your health care provider know about all the medical conditions and surgeries you have or have had. This is a safe procedure. However, some problems may occur, including damage to nearby structures or organs, bleeding, blood clots, or allergies. Follow your health care provider's instructions about eating, drinking, medicines, and other lifestyle changes, such as quitting tobacco use before the procedure. This information is not intended to replace advice given to you by your health care provider. Make sure you discuss any questions you have with your health care provider. Document Revised: 03/07/2019 Document Reviewed: 03/07/2019 Elsevier Patient Education  2021 Elsevier Inc.   Transesophageal Echocardiogram Transesophageal echocardiogram (TEE) is a test that uses sound waves to take pictures of your heart. TEE is done by passing a small probe attached to a flexible tube down the part of the body that moves food from your mouth to your stomach  (esophagus). The pictures give clear images of your heart. This can help your doctor see if there are problems with your heart. Tell a doctor about: Any allergies you have. All medicines you are taking. This includes vitamins, herbs, eye drops, creams, and over-the-counter medicines. Any problems you or family members have had with anesthetic medicines. Any blood disorders you have. Any surgeries you have had. Any medical conditions you have. Any swallowing problems. Whether you have or have had a blockage in the part of the body that moves food from your mouth to your stomach. Whether you are pregnant or may be pregnant. What are the risks? In general, this is a safe procedure. But, problems may occur, such as: Damage to nearby structures or organs. A tear in the part of the body that moves food from your mouth to your stomach. Irregular heartbeat. Hoarse voice or trouble swallowing. Bleeding. What happens before the procedure? Medicines Ask your doctor about changing or stopping: Your normal medicines. Vitamins, herbs, and supplements. Over-the-counter medicines. Do not take aspirin  or ibuprofen unless you are told to. General instructions Follow instructions from your doctor about what you cannot eat or drink. You will take out any dentures or dental retainers. Plan to have a responsible adult take you home from the hospital or clinic. Plan to have a responsible adult care for you for the time you are told after you leave the hospital or clinic. This is important. What happens during the procedure?  An IV will be put into one of your veins. You may be given: A sedative. This medicine helps you relax. A medicine to numb the back of your throat. This may be sprayed or gargled. Your blood pressure, heart rate, and breathing will be watched. You may be asked to lie on your left side. A bite block will be placed in your mouth. This keeps you from biting the tube. The tip of  the  probe will be placed into the back of your mouth. You will be asked to swallow. Your doctor will take pictures of your heart. The probe and bite block will be taken out after the test is done. The procedure may vary among doctors and hospitals. What can I expect after the procedure? You will be monitored until you leave the hospital or clinic. This includes checking your blood pressure, heart rate, breathing rate, and blood oxygen level. Your throat may feel sore and numb. This will get better over time. You will not be allowed to eat or drink until the numbness has gone away. It is common to have a sore throat for a day or two. It is up to you to get the results of your procedure. Ask how to get your results when they are ready. Follow these instructions at home: If you were given a sedative during your procedure, do not drive or use machines until your doctor says that it is safe. Return to your normal activities when your doctor says that it is safe. Keep all follow-up visits. Summary TEE is a test that uses sound waves to take pictures of your heart. You will be given a medicine to help you relax. Do not drive or use machines until your doctor says that it is safe. This information is not intended to replace advice given to you by your health care provider. Make sure you discuss any questions you have with your health care provider. Document Revised: 04/29/2021 Document Reviewed: 04/08/2020 Elsevier Patient Education  2024 [employer].

## 2023-08-30 NOTE — Progress Notes (Signed)
 Cardiology Office Note:    Date:  08/30/2023   ID:  CLAUDIUS Cole, DOB [birth year], MRN [id number]  PCP:  Regino Slater, MD  Cardiologist:  Jennifer JONELLE Crape, MD   Referring MD: Regino Slater, MD    ASSESSMENT:    1. Severe aortic stenosis   2. Aortic valve mass   3. Aneurysm of ascending aorta without rupture (HCC)    PLAN:    In order of problems listed above:  Primary prevention stressed with the patient.  Importance of compliance with diet medication stressed and patient verbalized standing. Severe aortic stenosis: Mass noted on aortic valve unclear etiology.  I discussed this with him at length.  I would like him to get a TEE first to assess the nature of the mass especially in view of the fact that he will have coronary angiography and left heart catheterization and we just want to make sure that this is not any thrombotic mass or such.  He is agreeable.  Benefits and potential risks of transesophageal echocardiography discussed and questions answered to satisfaction.  In preparation for intervention on the valve I also discussed coronary angiography and left heart catheterization and he is agreeable.  Patient has ascending aortic aneurysm so I think it will need multidisciplinary evaluation after these tests are done from our structural heart disease colleagues in cardiothoracic and vascular colleagues to decide what is the best course of treatment in his condition.  He is agreeable.I discussed coronary angiography and left heart catheterization with the patient at extensive length. Procedure, benefits and potential risks were explained. Patient had multiple questions which were answered to the patient's satisfaction. Patient agreed and consented for the procedure. Further recommendations will be made based on the findings of the coronary angiography. In the interim. The patient has any significant symptoms he knows to go to the nearest emergency room. Mixed dyslipidemia: Not on  lipid-lowering medications.  He has had a calcium  scoring test done and I will awaiting those results.  Diet emphasized.  He has markedly elevated LDL. Elevated blood pressure: Patient has no history of hypertension.  He appears very anxious today.  Lifestyle modification urged he was advised to keep a track of his blood pressures. He will be seen in follow-up appointment after the above-mentioned testing.   Medication Adjustments/Labs and Tests Ordered: Current medicines are reviewed at length with the patient today.  Concerns regarding medicines are outlined above.  No orders of the defined types were placed in this encounter.  No orders of the defined types were placed in this encounter.    No chief complaint on file.    History of Present Illness:    Douglas Cole is a 62 y.o. male.  Patient has past medical history of ascending aortic aneurysm, recently diagnosed severe aortic stenosis and mass on the aortic valve of unclear nature.  Patient has ascending aortic aneurysm.  He has history of bicuspid aortic valve.  Patient also has history of prostate cancer but he tells me that currently he is in remission and is not detectable.  He has had evaluations for this.  He denies any chest pain orthopnea or PND.  No syncope.  At the time of my evaluation, the patient is alert awake oriented and in no distress.  His wife accompanies him for this visit.  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   Elevated liver enzymes last week   Gastritis, acute 12/22/2013   GERD (gastroesophageal reflux disease)    ocassional   Headache    Heart murmur    Migraine 12/19/2013   Tachycardia 12/19/2013   Transaminitis 12/19/2013    Past Surgical History:  Procedure Laterality Date   CHOLECYSTECTOMY  05/28/11    lap chole    DISTAL BICEPS TENDON REPAIR Right 05/27/2021    Procedure: Right elbow distal biceps repair, possible allograft reconstruction;  Surgeon: Alyse Agent, MD;  Location: [facility] OR;  Service: Orthopedics;  Laterality: Right;   ESOPHAGOGASTRODUODENOSCOPY N/A 12/21/2013   Procedure: ESOPHAGOGASTRODUODENOSCOPY (EGD);  Surgeon: Agent LITTIE Celestia Mickey., MD;  Location: [facility];  Service: Endoscopy;  Laterality: N/A;   EUS N/A 12/04/2014   Procedure: ESOPHAGEAL ENDOSCOPIC ULTRASOUND (EUS) RADIAL;  Surgeon: Elsie Cree, MD;  Location: [facility];  Service: Endoscopy;  Laterality: N/A;    Current Medications: Current Meds  Medication Sig   tamsulosin  (FLOMAX ) 0.4 MG CAPS capsule Take 0.4 mg by mouth daily.     Allergies:   Penicillins   Social History   Socioeconomic History   Marital status: Married    Spouse name: Not on file   Number of children: Not on file   Years of education: Not on file   Highest education level: Not on file  Occupational History   Not on file  Tobacco Use   Smoking status: Never   Smokeless tobacco: Never  Vaping Use   Vaping status: Never Used  Substance and Sexual Activity   Alcohol use: Yes    Alcohol/week: 3.0 standard drinks of alcohol    Types: 3 Cans of beer per week    Comment: occasional   Drug use: No   Sexual activity: Not on file  Other Topics Concern   Not on file  Social History Narrative   Not on file   Social Drivers of Health   Financial Resource Strain: Not on file  Food Insecurity: Not on file  Transportation Needs: Not on file  Physical Activity: Not on file  Stress: Not on file  Social Connections: Unknown (01/12/2022)   Received [facility]   Social Network    Social Network: Not on file     Family History: The patient's family history includes Cancer in his mother.  ROS:   Please see the history of present illness.    All other systems reviewed and are negative.  EKGs/Labs/Other Studies Reviewed:    The following studies were reviewed  today: I discussed my findings with the patient at length   Recent Labs: No results found for requested labs within last 365 days.  Recent Lipid Panel No results found for: CHOL, TRIG, HDL, CHOLHDL, VLDL, LDLCALC, LDLDIRECT  Physical Exam:    VS:  BP (!) 158/80   Pulse (!) 110   Ht 5' 6 (1.676 m)   Wt 171 lb (77.6 kg)   SpO2 97%   BMI 27.60 kg/m     Wt Readings from Last 3 Encounters:  08/30/23 171 lb (77.6 kg)  08/18/23 173 lb 3.2 oz (78.6 kg)  08/01/23 175 lb (79.4 kg)     GEN: Patient is in no acute distress HEENT: Normal NECK: No JVD; No carotid bruits LYMPHATICS: No lymphadenopathy CARDIAC: Hear sounds regular, 2/6 systolic murmur at the apex and 4/6 systolic murmur at the aortic area. RESPIRATORY:  Clear to auscultation without rales, wheezing or rhonchi  ABDOMEN: Soft,  non-tender, non-distended MUSCULOSKELETAL:  No edema; No deformity  SKIN: Warm and dry NEUROLOGIC:  Alert and oriented x 3 PSYCHIATRIC:  Normal affect   Signed, Jennifer JONELLE Crape, MD  08/30/2023 8:51 AM    [facility]

## 2023-08-31 LAB — CBC
Hematocrit: 45.2 % (ref 37.5–51.0)
Hemoglobin: 15.3 g/dL (ref 13.0–17.7)
MCH: 29.6 pg (ref 26.6–33.0)
MCHC: 33.8 g/dL (ref 31.5–35.7)
MCV: 87 fL (ref 79–97)
Platelets: 171 10*3/uL (ref 150–450)
RBC: 5.17 x10E6/uL (ref 4.14–5.80)
RDW: 12.9 % (ref 11.6–15.4)
WBC: 4.6 10*3/uL (ref 3.4–10.8)

## 2023-08-31 LAB — BASIC METABOLIC PANEL
BUN/Creatinine Ratio: 13 (ref 10–24)
BUN: 11 mg/dL (ref 8–27)
CO2: 25 mmol/L (ref 20–29)
Calcium: 9.5 mg/dL (ref 8.6–10.2)
Chloride: 102 mmol/L (ref 96–106)
Creatinine, Ser: 0.88 mg/dL (ref 0.76–1.27)
Glucose: 93 mg/dL (ref 70–99)
Potassium: 4.4 mmol/L (ref 3.5–5.2)
Sodium: 141 mmol/L (ref 134–144)
eGFR: 97 mL/min/{1.73_m2} (ref >59)

## 2023-09-02 NOTE — Progress Notes (Signed)
 Spoke to pt and instructed them to come at 0830 and to be NPO after 0000.   Confirmed that pt will have a ride home and someone to stay with them for 24 hours after the procedure. Instructed patient to not wear any jewelry or lotion.

## 2023-09-05 ENCOUNTER — Ambulatory Visit (HOSPITAL_COMMUNITY)
Admission: RE | Admit: 2023-09-05 | Discharge: 2023-09-05 | Disposition: A | Discharge status: Home / Self Care | Payer: 59 | Source: Ambulatory Visit | Attending: Cardiology | Admitting: Cardiology

## 2023-09-05 ENCOUNTER — Encounter (HOSPITAL_COMMUNITY)
Admission: RE | Disposition: A | Discharge status: Home / Self Care | Payer: Self-pay | Source: Home / Self Care | Attending: Internal Medicine

## 2023-09-05 ENCOUNTER — Other Ambulatory Visit: Payer: Self-pay

## 2023-09-05 ENCOUNTER — Encounter (HOSPITAL_COMMUNITY): Payer: Self-pay | Admitting: Internal Medicine

## 2023-09-05 ENCOUNTER — Ambulatory Visit (HOSPITAL_COMMUNITY)
Admission: RE | Admit: 2023-09-05 | Discharge: 2023-09-05 | Disposition: A | Discharge status: Home / Self Care | Payer: 59 | Attending: Internal Medicine | Admitting: Internal Medicine

## 2023-09-05 ENCOUNTER — Ambulatory Visit (HOSPITAL_COMMUNITY): Payer: 59 | Admitting: Anesthesiology

## 2023-09-05 ENCOUNTER — Ambulatory Visit (HOSPITAL_COMMUNITY)
Admission: RE | Disposition: A | Discharge status: Home / Self Care | Payer: Self-pay | Source: Home / Self Care | Attending: Internal Medicine

## 2023-09-05 DIAGNOSIS — I350 Nonrheumatic aortic (valve) stenosis: Secondary | ICD-10-CM | POA: Diagnosis present

## 2023-09-05 DIAGNOSIS — Z8546 Personal history of malignant neoplasm of prostate: Secondary | ICD-10-CM | POA: Diagnosis not present

## 2023-09-05 DIAGNOSIS — E782 Mixed hyperlipidemia: Secondary | ICD-10-CM | POA: Diagnosis not present

## 2023-09-05 DIAGNOSIS — I7121 Aneurysm of the ascending aorta, without rupture: Secondary | ICD-10-CM | POA: Diagnosis not present

## 2023-09-05 DIAGNOSIS — I358 Other nonrheumatic aortic valve disorders: Secondary | ICD-10-CM

## 2023-09-05 DIAGNOSIS — R030 Elevated blood-pressure reading, without diagnosis of hypertension: Secondary | ICD-10-CM | POA: Insufficient documentation

## 2023-09-05 HISTORY — PX: RIGHT HEART CATH AND CORONARY ANGIOGRAPHY: CATH118264

## 2023-09-05 HISTORY — PX: TRANSESOPHAGEAL ECHOCARDIOGRAM (CATH LAB): EP1270

## 2023-09-05 LAB — POCT I-STAT 7, (LYTES, BLD GAS, ICA,H+H)
Acid-base deficit: 1 mmol/L (ref 0.0–2.0)
Bicarbonate: 23.6 mmol/L (ref 20.0–28.0)
Calcium, Ion: 1.16 mmol/L (ref 1.15–1.40)
HCT: 41 % (ref 39.0–52.0)
Hemoglobin: 13.9 g/dL (ref 13.0–17.0)
O2 Saturation: 95 %
Potassium: 3.6 mmol/L (ref 3.5–5.1)
Sodium: 143 mmol/L (ref 135–145)
TCO2: 25 mmol/L (ref 22–32)
pCO2 arterial: 39.1 mm[Hg] (ref 32–48)
pH, Arterial: 7.388 (ref 7.35–7.45)
pO2, Arterial: 79 mm[Hg] — ABNORMAL LOW (ref 83–108)

## 2023-09-05 LAB — POCT I-STAT EG7
Acid-base deficit: 1 mmol/L (ref 0.0–2.0)
Bicarbonate: 24.4 mmol/L (ref 20.0–28.0)
Calcium, Ion: 1.2 mmol/L (ref 1.15–1.40)
HCT: 42 % (ref 39.0–52.0)
Hemoglobin: 14.3 g/dL (ref 13.0–17.0)
O2 Saturation: 73 %
Potassium: 3.7 mmol/L (ref 3.5–5.1)
Sodium: 143 mmol/L (ref 135–145)
TCO2: 26 mmol/L (ref 22–32)
pCO2, Ven: 42.2 mm[Hg] — ABNORMAL LOW (ref 44–60)
pH, Ven: 7.369 (ref 7.25–7.43)
pO2, Ven: 40 mm[Hg] (ref 32–45)

## 2023-09-05 LAB — ECHO TEE
AR max vel: 0.89 cm2
AV Area VTI: 0.9 cm2
AV Area mean vel: 1.04 cm2
AV Mean grad: 28 mm[Hg]
AV Peak grad: 53.9 mm[Hg]
Ao pk vel: 3.67 m/s

## 2023-09-05 SURGERY — RIGHT HEART CATH AND CORONARY ANGIOGRAPHY
Anesthesia: LOCAL

## 2023-09-05 SURGERY — TRANSESOPHAGEAL ECHOCARDIOGRAM (TEE) (CATHLAB)
Anesthesia: Monitor Anesthesia Care

## 2023-09-05 MED ORDER — LIDOCAINE HCL (PF) 1 % IJ SOLN
INTRAMUSCULAR | Status: AC
  Filled 2023-09-05: qty 30

## 2023-09-05 MED ORDER — FENTANYL CITRATE (PF) 100 MCG/2ML IJ SOLN
INTRAMUSCULAR | Status: AC
  Filled 2023-09-05: qty 2

## 2023-09-05 MED ORDER — LIDOCAINE HCL (PF) 1 % IJ SOLN
INTRAMUSCULAR | Status: DC | PRN
  Administered 2023-09-05 (×2): 2 mL

## 2023-09-05 MED ORDER — SODIUM CHLORIDE 0.9% FLUSH: 3.0000 mL | INTRAVENOUS | Status: DC | PRN

## 2023-09-05 MED ORDER — SODIUM CHLORIDE 0.9% FLUSH: 3.0000 mL | Freq: Two times a day (BID) | INTRAVENOUS | Status: DC

## 2023-09-05 MED ORDER — MIDAZOLAM HCL 2 MG/2ML IJ SOLN
INTRAMUSCULAR | Status: AC
Start: 2023-09-05 — End: ?
  Filled 2023-09-05: qty 2

## 2023-09-05 MED ORDER — SODIUM CHLORIDE 0.9 % IV SOLN: INTRAVENOUS | Status: DC | PRN

## 2023-09-05 MED ORDER — ONDANSETRON HCL 4 MG/2ML IJ SOLN: 4.0000 mg | Freq: Four times a day (QID) | INTRAMUSCULAR | Status: DC | PRN

## 2023-09-05 MED ORDER — SODIUM CHLORIDE 0.9 % IV SOLN: INTRAVENOUS | Status: AC

## 2023-09-05 MED ORDER — SODIUM CHLORIDE 0.9 % WEIGHT BASED INFUSION: 1.0000 mL/kg/h | INTRAVENOUS | Status: DC

## 2023-09-05 MED ORDER — HYDRALAZINE HCL 20 MG/ML IJ SOLN: 10.0000 mg | INTRAMUSCULAR | Status: DC | PRN

## 2023-09-05 MED ORDER — VERAPAMIL HCL 2.5 MG/ML IV SOLN
INTRAVENOUS | Status: DC | PRN
  Administered 2023-09-05: 10 mL via INTRA_ARTERIAL

## 2023-09-05 MED ORDER — HEPARIN (PORCINE) IN NACL 1000-0.9 UT/500ML-% IV SOLN
INTRAVENOUS | Status: DC | PRN
  Administered 2023-09-05 (×2): 500 mL

## 2023-09-05 MED ORDER — ACETAMINOPHEN 325 MG PO TABS: 650.0000 mg | ORAL_TABLET | ORAL | Status: DC | PRN

## 2023-09-05 MED ORDER — SODIUM CHLORIDE 0.9 % IV SOLN: 250.0000 mL | INTRAVENOUS | Status: DC | PRN

## 2023-09-05 MED ORDER — PROPOFOL 10 MG/ML IV BOLUS
INTRAVENOUS | Status: DC | PRN
  Administered 2023-09-05: 100 mg via INTRAVENOUS
  Administered 2023-09-05: 20 mg via INTRAVENOUS
  Administered 2023-09-05: 30 mg via INTRAVENOUS
  Administered 2023-09-05: 40 mg via INTRAVENOUS

## 2023-09-05 MED ORDER — LABETALOL HCL 5 MG/ML IV SOLN: 10.0000 mg | INTRAVENOUS | Status: DC | PRN

## 2023-09-05 MED ORDER — LIDOCAINE 2% (20 MG/ML) 5 ML SYRINGE
INTRAMUSCULAR | Status: DC | PRN
  Administered 2023-09-05: 100 mg via INTRAVENOUS

## 2023-09-05 MED ORDER — VERAPAMIL HCL 2.5 MG/ML IV SOLN
INTRAVENOUS | Status: AC
  Filled 2023-09-05: qty 2

## 2023-09-05 MED ORDER — HEPARIN SODIUM (PORCINE) 1000 UNIT/ML IJ SOLN
INTRAMUSCULAR | Status: AC
  Filled 2023-09-05: qty 10

## 2023-09-05 MED ORDER — SODIUM CHLORIDE 0.9 % WEIGHT BASED INFUSION
3.0000 mL/kg/h | INTRAVENOUS | Status: AC
  Administered 2023-09-05: 3 mL/kg/h via INTRAVENOUS

## 2023-09-05 MED ORDER — PROPOFOL 500 MG/50ML IV EMUL
INTRAVENOUS | Status: DC | PRN
  Administered 2023-09-05: 100 ug/kg/min via INTRAVENOUS

## 2023-09-05 MED ORDER — ASPIRIN 81 MG PO CHEW: 81.0000 mg | CHEWABLE_TABLET | ORAL | Status: DC

## 2023-09-05 SURGICAL SUPPLY — 12 items
CATH BALLN WEDGE 5F 110CM (CATHETERS) IMPLANT
CATH INFINITI AMBI 5FR TG (CATHETERS) IMPLANT
DEVICE RAD COMP TR BAND LRG (VASCULAR PRODUCTS) IMPLANT
GLIDESHEATH SLEND A-KIT 6F 22G (SHEATH) IMPLANT
GUIDEWIRE INQWIRE 1.5J.035X260 (WIRE) IMPLANT
INQWIRE 1.5J .035X260CM (WIRE) ×1 IMPLANT
KIT SYRINGE INJ CVI SPIKEX1 (MISCELLANEOUS) IMPLANT
PACK CARDIAC CATHETERIZATION (CUSTOM PROCEDURE TRAY) ×2 IMPLANT
SET ATX-X65L (MISCELLANEOUS) IMPLANT
SHEATH GLIDE SLENDER 4/5FR (SHEATH) IMPLANT
SHEATH PROBE COVER 6X72 (BAG) IMPLANT
WIRE EMERALD 3MM-J .025X260CM (WIRE) IMPLANT

## 2023-09-05 NOTE — Interval H&P Note (Signed)
 History and Physical Interval Note:  09/05/2023 1:59 PM  Douglas Cole  has presented today for surgery, with the diagnosis of aortic stenosis.  The various methods of treatment have been discussed with the patient and family. After consideration of risks, benefits and other options for treatment, the patient has consented to  Procedure(s): LEFT HEART CATH AND CORONARY ANGIOGRAPHY (N/A) as a surgical intervention.  The patient's history has been reviewed, patient examined, no change in status, stable for surgery.  I have reviewed the patient's chart and labs.  Questions were answered to the patient's satisfaction.     [name] J Francine Hannan

## 2023-09-05 NOTE — Transfer of Care (Signed)
 Immediate Anesthesia Transfer of Care Note  Patient: Douglas Cole  Procedure(s) Performed: TRANSESOPHAGEAL ECHOCARDIOGRAM  Patient Location: PACU and Cath Lab  Anesthesia Type:MAC  Level of Consciousness: awake  Airway & Oxygen Therapy: Patient Spontanous Breathing  Post-op Assessment: Report given to RN and Post -op Vital signs reviewed and stable  Post vital signs: Reviewed and stable  Last Vitals:  Vitals Value Taken Time  BP    Temp    Pulse    Resp    SpO2      Last Pain:  Vitals:   09/05/23 0921  TempSrc:   PainSc: 0-No pain         Complications: No notable events documented.

## 2023-09-05 NOTE — CV Procedure (Addendum)
 INDICATIONS: Severe aortic stenosis, aortic valve calcifications  PROCEDURE:   Informed consent was obtained prior to the procedure. The risks, benefits and alternatives for the procedure were discussed and the patient comprehended these risks.  Risks include, but are not limited to, cough, sore throat, vomiting, nausea, somnolence, esophageal and stomach trauma or perforation, bleeding, low blood pressure, aspiration, pneumonia, infection, trauma to the teeth and death.    After a procedural time-out, the oropharynx was anesthetized with 20% benzocaine  spray.   During this procedure the patient was administered propofol  per anesthesia.  The patient's heart rate, blood pressure, and oxygen saturation were monitored continuously during the procedure. The period of conscious sedation was 20 minutes, of which I was present face-to-face 100% of this time.  The transesophageal probe was inserted in the esophagus without difficulty and multiple views were obtained. Probe could not be easily passed into the stomach, deep transgastric images not performed. The patient was kept under observation until the patient left the procedure room.  The patient left the procedure room in stable condition.   Agitated microbubble saline contrast was administered.  COMPLICATIONS:    There were no immediate complications.  FINDINGS:   FORMAL ECHOCARDIOGRAM REPORT PENDING Normal biventricular size and function.  Bulky calcifications of the aortic valve. Bicuspid aortic valve with fusion of non- and right coronary cusps. Calcified raphe with calcium  extending to the more anterior commissure.  Probe could not be easily passed into the stomach, gradients not obtained by TEE.    RECOMMENDATIONS:    none  Time Spent Directly with the Patient:  30 minutes   Douglas Cole [name] 09/05/2023, 11:09 AM

## 2023-09-05 NOTE — Anesthesia Postprocedure Evaluation (Signed)
 Anesthesia Post Note  Patient: GWENDOLYN NISHI  Procedure(s) Performed: TRANSESOPHAGEAL ECHOCARDIOGRAM     Patient location during evaluation: PACU Anesthesia Type: MAC Level of consciousness: awake and alert Pain management: pain level controlled Vital Signs Assessment: post-procedure vital signs reviewed and stable Respiratory status: spontaneous breathing, nonlabored ventilation, respiratory function stable and patient connected to nasal cannula oxygen Cardiovascular status: stable and blood pressure returned to baseline Postop Assessment: no apparent nausea or vomiting Anesthetic complications: no   No notable events documented.  Last Vitals:  Vitals:   09/05/23 1150 09/05/23 1200  BP: 129/78 107/70  Pulse: 96 78  Resp: 19 16  Temp:    SpO2: 100% 100%    Last Pain:  Vitals:   09/05/23 1140  TempSrc:   PainSc: 0-No pain                 Lynwood MARLA Cornea

## 2023-09-05 NOTE — H&P (Signed)
 HPI: Pt referred for TEE for aortic valve calcifications, define bulky calcified mass further.  GEN: No acute distress.   Neck: No JVD Cardiac: RRR, 2/6 systolic murmur Respiratory: Clear to auscultation bilaterally. GI: Soft, nontender, non-distended  MS: No edema; No deformity. Neuro:  Nonfocal  Psych: Normal affect   AP: Proceed with TEE. Labs reviewed. INFORMED CONSENT: After careful review of history and examination, the risks and benefits of transesophageal echocardiogram have been explained. Risks include esophageal damage, perforation (1:10,000 risk), bleeding, tooth fracture, pharyngeal hematoma as well as other potential complications associated with conscious sedation including aspiration, arrhythmia, respiratory failure and death. Alternatives to treatment were discussed, questions were answered. Patient is willing to proceed.  -------------------------------------------------------------------  Cardiology Office Note:    Date:  09/05/2023   ID:  Douglas Cole, DOB 02/15/1961, MRN [id number]  PCP:  Regino Slater, MD  Cardiologist:  Soyla DELENA Merck, MD   Referring MD: No ref. provider found    ASSESSMENT:    1. Severe aortic stenosis   2. Aortic valve mass   3. Aneurysm of ascending aorta without rupture (HCC)    PLAN:    In order of problems listed above:  Primary prevention stressed with the patient.  Importance of compliance with diet medication stressed and patient verbalized standing. Severe aortic stenosis: Mass noted on aortic valve unclear etiology.  I discussed this with him at length.  I would like him to get a TEE first to assess the nature of the mass especially in view of the fact that he will have coronary angiography and left heart catheterization and we just want to make sure that this is not any thrombotic mass or such.  He is agreeable.  Benefits and potential risks of transesophageal echocardiography discussed and questions answered to satisfaction.   In preparation for intervention on the valve I also discussed coronary angiography and left heart catheterization and he is agreeable.  Patient has ascending aortic aneurysm so I think it will need multidisciplinary evaluation after these tests are done from our structural heart disease colleagues in cardiothoracic and vascular colleagues to decide what is the best course of treatment in his condition.  He is agreeable.I discussed coronary angiography and left heart catheterization with the patient at extensive length. Procedure, benefits and potential risks were explained. Patient had multiple questions which were answered to the patient's satisfaction. Patient agreed and consented for the procedure. Further recommendations will be made based on the findings of the coronary angiography. In the interim. The patient has any significant symptoms he knows to go to the nearest emergency room. Mixed dyslipidemia: Not on lipid-lowering medications.  He has had a calcium  scoring test done and I will awaiting those results.  Diet emphasized.  He has markedly elevated LDL. Elevated blood pressure: Patient has no history of hypertension.  He appears very anxious today.  Lifestyle modification urged he was advised to keep a track of his blood pressures. He will be seen in follow-up appointment after the above-mentioned testing.   Medication Adjustments/Labs and Tests Ordered: Current medicines are reviewed at length with the patient today.  Concerns regarding medicines are outlined above.  Orders Placed This Encounter  Procedures   Diet NPO time specified   Informed Consent Details: Physician/Practitioner Attestation; Transcribe to consent form and obtain patient signature   Cardiac monitoring   Vital signs   Measure blood pressure   Verify informed consent   Confirm CBC and BMP (or CMP) results within 7 days for inpatient  and 30 days for outpatient:   Remove and safely store all jewelry.  Tape rings in place  that cannot be removed.   Patient to wear a single hospital gown - Ask patient to remove dentures, if any   Positioning instruction   Echo machine on patient's right. Verify adequate EKG signal.   When patient fully awake discontinue Oxygen, change IV to saline lock and activity Ad Lib - Notify physician when completed   Notify physician (specify)   Pre-admission testing diagnosis   Apply Cardiac or Vascular Catheterization and/or Intervention Care Plan   Confirm CBC and BMP (or CMP) results within 7 days for inpatient and 30 days for outpatient: Outpatients with severe anemia (hgb<10, CKD, severe thrombocytopenia plts<100) labs should be within 10 days. Only draw PT/INR on patients that are on Coumadin, Hgb<10, have liver disease (cirrhosis, liver CA, hepatitis, etc). Urine pregnancy test within hospital admission for inpatients of child bearing age, for outpatients day of procedure.   Confirm EKG performed within 30 days for cardiac procedures and 12 months for peripheral vascular procedures.  Place order for EKG if missing or not within timeframe.   Verify aspirin  and / or anti-platelet medication (Plavix, Effient, Brilinta) dose available for cardiac / peripheral vascular procedure day. IF ordered daily / once, adjust schedule to administer before procedure.   Weigh patient   Initiate Cath/PCI clinical path; encourage patient to watch CCTV video   Informed Consent Details: Physician/Practitioner Attestation; Transcribe to consent form and obtain patient signature   Informed Consent Details: Physician/Practitioner Attestation; Transcribe to consent form and obtain patient signature   Pulse oximetry, continuous   Oxygen therapy Mode or (Route): Nasal cannula; Liters Per Minute: 2   EP STUDY   ECHO TEE   Insert peripheral IV   Insert peripheral IV   Insert 2nd peripheral IV site-Saline lock IV   Meds ordered this encounter  Medications   DISCONTD: aspirin  chewable tablet 81 mg   FOLLOWED  BY Linked Order Group    0.9% sodium chloride  infusion    0.9% sodium chloride  infusion   aspirin  chewable tablet 81 mg     No chief complaint on file.    History of Present Illness:    Douglas Cole is a 63 y.o. male.  Patient has past medical history of ascending aortic aneurysm, recently diagnosed severe aortic stenosis and mass on the aortic valve of unclear nature.  Patient has ascending aortic aneurysm.  He has history of bicuspid aortic valve.  Patient also has history of prostate cancer but he tells me that currently he is in remission and is not detectable.  He has had evaluations for this.  He denies any chest pain orthopnea or PND.  No syncope.  At the time of my evaluation, the patient is alert awake oriented and in no distress.  His wife accompanies him for this visit.  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   Elevated liver enzymes last week   Gastritis, acute 12/22/2013   GERD (gastroesophageal reflux disease)    ocassional   Headache    Heart murmur    Migraine 12/19/2013   Tachycardia 12/19/2013   Transaminitis 12/19/2013    Past Surgical History:  Procedure Laterality Date   CHOLECYSTECTOMY  05/28/11    lap chole    DISTAL BICEPS TENDON REPAIR Right 05/27/2021  Procedure: Right elbow distal biceps repair, possible allograft reconstruction;  Surgeon: Alyse Agent, MD;  Location: [facility] OR;  Service: Orthopedics;  Laterality: Right;   ESOPHAGOGASTRODUODENOSCOPY N/A 12/21/2013   Procedure: ESOPHAGOGASTRODUODENOSCOPY (EGD);  Surgeon: Agent LITTIE Celestia Mickey., MD;  Location: [facility];  Service: Endoscopy;  Laterality: N/A;   EUS N/A 12/04/2014   Procedure: ESOPHAGEAL ENDOSCOPIC ULTRASOUND (EUS) RADIAL;  Surgeon: Elsie Cree, MD;  Location: [facility];  Service: Endoscopy;  Laterality: N/A;    Current Medications: Current Meds   Medication Sig   tamsulosin  (FLOMAX ) 0.4 MG CAPS capsule Take 0.4 mg by mouth daily.     Allergies:   Penicillins   Social History   Socioeconomic History   Marital status: Married    Spouse name: Not on file   Number of children: Not on file   Years of education: Not on file   Highest education level: Not on file  Occupational History   Not on file  Tobacco Use   Smoking status: Never   Smokeless tobacco: Never  Vaping Use   Vaping status: Never Used  Substance and Sexual Activity   Alcohol use: Yes    Alcohol/week: 3.0 standard drinks of alcohol    Types: 3 Cans of beer per week    Comment: occasional   Drug use: No   Sexual activity: Not on file  Other Topics Concern   Not on file  Social History Narrative   Not on file   Social Drivers of Health   Financial Resource Strain: Not on file  Food Insecurity: Not on file  Transportation Needs: Not on file  Physical Activity: Not on file  Stress: Not on file  Social Connections: Unknown (01/12/2022)   Received from [facility]   Social Network    Social Network: Not on file     Family History: The patient's family history includes Cancer in his mother.  ROS:   Please see the history of present illness.    All other systems reviewed and are negative.  EKGs/Labs/Other Studies Reviewed:    The following studies were reviewed today: I discussed my findings with the patient at length   Recent Labs: 08/30/2023: BUN 11; Creatinine, Ser 0.88; Hemoglobin 15.3; Platelets 171; Potassium 4.4; Sodium 141  Recent Lipid Panel No results found for: CHOL, TRIG, HDL, CHOLHDL, VLDL, LDLCALC, LDLDIRECT  Physical Exam:    VS:  BP (!) 146/92   Pulse 86   Temp 98.3 F (36.8 C) (Temporal)   Resp 16   Ht 5' 6 (1.676 m)   Wt 74.8 kg   SpO2 99%   BMI 26.63 kg/m     Wt Readings from Last 3 Encounters:  09/05/23 74.8 kg  08/30/23 77.6 kg  08/18/23 78.6 kg     GEN: Patient is in no  acute distress HEENT: Normal NECK: No JVD; No carotid bruits LYMPHATICS: No lymphadenopathy CARDIAC: Hear sounds regular, 2/6 systolic murmur at the apex and 4/6 systolic murmur at the aortic area. RESPIRATORY:  Clear to auscultation without rales, wheezing or rhonchi  ABDOMEN: Soft, non-tender, non-distended MUSCULOSKELETAL:  No edema; No deformity  SKIN: Warm and dry NEUROLOGIC:  Alert and oriented x 3 PSYCHIATRIC:  Normal affect

## 2023-09-05 NOTE — Anesthesia Preprocedure Evaluation (Signed)
 Anesthesia Evaluation  Patient identified by MRN, date of birth, ID band Patient awake    Reviewed: Allergy & Precautions, NPO status , Patient's Chart, lab work & pertinent test results, reviewed documented beta blocker date and time   History of Anesthesia Complications Negative for: history of anesthetic complications  Airway Mallampati: II  TM Distance: >3 FB     Dental no notable dental hx.    Pulmonary neg COPD, neg PE   breath sounds clear to auscultation       Cardiovascular (-) hypertension(-) CAD and (-) Past MI + Valvular Problems/Murmurs AS  Rhythm:Regular Rate:Normal  Severe AS, aortic valve mass   Neuro/Psych  Headaches, neg Seizures    GI/Hepatic ,GERD  ,,(+) neg Cirrhosis        Endo/Other    Renal/GU Renal disease     Musculoskeletal   Abdominal   Peds  Hematology   Anesthesia Other Findings   Reproductive/Obstetrics                             Anesthesia Physical Anesthesia Plan  ASA: 3  Anesthesia Plan: MAC   Post-op Pain Management:    Induction:   PONV Risk Score and Plan: 1 and Ondansetron   Airway Management Planned:   Additional Equipment:   Intra-op Plan:   Post-operative Plan:   Informed Consent: I have reviewed the patients History and Physical, chart, labs and discussed the procedure including the risks, benefits and alternatives for the proposed anesthesia with the patient or authorized representative who has indicated his/her understanding and acceptance.     Dental advisory given  Plan Discussed with: CRNA  Anesthesia Plan Comments:        Anesthesia Quick Evaluation

## 2023-09-05 NOTE — Discharge Instructions (Addendum)
 TEE   YOU HAD AN CARDIAC PROCEDURE TODAY: Refer to the procedure report and other information in the discharge instructions given to you for any specific questions about what was found during the examination. If this information does not answer your questions, please call Dr. Godfrey office at [phone] to clarify.   DIET: Your first meal following the procedure should be a light meal and then it is ok to progress to your normal diet. A half-sandwich or bowl of soup is an example of a good first meal. Heavy or fried foods are harder to digest and may make you feel nauseous or bloated. Drink plenty of fluids but you should avoid alcoholic beverages for 24 hours. If you had a esophageal dilation, please see attached instructions for diet.   ACTIVITY: Your care partner should take you home directly after the procedure. You should plan to take it easy, moving slowly for the rest of the day. You can resume normal activity the day after the procedure however YOU SHOULD NOT DRIVE, use power tools, machinery or perform tasks that involve climbing or major physical exertion for 24 hours (because of the sedation medicines used during the test).   SYMPTOMS TO REPORT IMMEDIATELY: A cardiologist can be reached at any hour. Please call [phone] for any of the following symptoms:  Vomiting of blood or coffee ground material  New, significant abdominal pain  New, significant chest pain or pain under the shoulder blades  Painful or persistently difficult swallowing  New shortness of breath  Black, tarry-looking or red, bloody stools  FOLLOW UP:  Please also call with any specific questions about appointments or follow up tests.Radial Site Care The following information offers guidance on how to care for yourself after your procedure. Your health care provider may also give you more specific instructions. If you have problems or questions, contact your health care provider. What can I expect after the  procedure? After the procedure, it is common to have bruising and tenderness in the incision area. Follow these instructions at home: Incision site care  Follow instructions from your health care provider about how to take care of your incision site. Make sure you: Wash your hands with soap and water for at least 20 seconds before and after you change your bandage (dressing). If soap and water are not available, use hand sanitizer. Remove your dressing in 24 hours. Leave stitches (sutures), skin glue, or adhesive strips in place. These skin closures may need to stay in place for 2 weeks or longer. If adhesive strip edges start to loosen and curl up, you may trim the loose edges. Do not remove adhesive strips completely unless your health care provider tells you to do that. Do not take baths, swim, or use a hot tub for at least 1 week. You may shower 24 hours after the procedure or as told by your health care provider. Remove the dressing and gently wash the incision area with plain soap and water. Pat the area dry with a clean towel. Do not rub the site. That could cause bleeding. Do not apply powder or lotion to the site. Check your incision site every day for signs of infection. Check for: Redness, swelling, or pain. Fluid or blood. Warmth. Pus or a bad smell. Activity For 24 hours after the procedure, or as directed by your health care provider: Do not flex or bend the affected arm. Do not push or pull heavy objects with the affected arm. Do not operate machinery or  power tools. Do not drive. You should not drive yourself home from the hospital or clinic if you go home during that time period. You may drive 24 hours after the procedure unless your health care provider tells you not to. Do not lift anything that is heavier than 10 lb (4.5 kg), or the limit that you are told, until your health care provider says that it is safe. Return to your normal activities as told by your health care  provider. Ask your health care provider what activities are safe for you and when you can return to work. If you were given a sedative during the procedure, it can affect you for several hours. Do not drive or operate machinery until your health care provider says that it is safe. General instructions Take over-the-counter and prescription medicines only as told by your health care provider. If you will be going home right after the procedure, plan to have a responsible adult care for you for the time you are told. This is important. Keep all follow-up visits. This is important. Contact a health care provider if: You have a fever or chills. You have any of these signs of infection at your incision site: Redness, swelling, or pain. Fluid or blood. Warmth. Pus or a bad smell. Get help right away if: The incision area swells very fast. The incision area is bleeding, and the bleeding does not stop when you hold steady pressure on the area. Your arm or hand becomes pale, cool, tingly, or numb. These symptoms may represent a serious problem that is an emergency. Do not wait to see if the symptoms will go away. Get medical help right away. Call your local emergency services (911 in the [place] Do not drive yourself to the hospital. Summary After the procedure, it is common to have bruising and tenderness at the incision site. Follow instructions from your health care provider about how to take care of your radial site incision. Check the incision every day for signs of infection. Do not lift anything that is heavier than 10 lb (4.5 kg), or the limit that you are told, until your health care provider says that it is safe. Get help right away if the incision area swells very fast, you have bleeding at the incision site that will not stop, or your arm or hand becomes pale, cool, or numb. This information is not intended to replace advice given to you by your health care provider. Make sure you discuss  any questions you have with your health care provider. Document Revised: 10/05/2020 Document Reviewed: 10/05/2020 Elsevier Patient Education  2024 [employer].

## 2023-09-06 ENCOUNTER — Encounter (HOSPITAL_COMMUNITY): Payer: Self-pay | Admitting: Internal Medicine

## 2023-09-08 MED FILL — Heparin Sodium (Porcine) Inj 1000 Unit/ML: INTRAMUSCULAR | Qty: 10 | Status: AC

## 2023-09-13 ENCOUNTER — Encounter: Payer: Self-pay | Admitting: Surgery

## 2023-09-13 ENCOUNTER — Encounter: Payer: Self-pay | Admitting: Family Medicine

## 2023-09-13 ENCOUNTER — Institutional Professional Consult (permissible substitution): Payer: 59 | Admitting: Surgery

## 2023-09-13 VITALS — BP 146/83 | HR 104 | Resp 18 | Ht 66.0 in | Wt 170.0 lb

## 2023-09-13 DIAGNOSIS — I7121 Aneurysm of the ascending aorta, without rupture: Secondary | ICD-10-CM | POA: Diagnosis not present

## 2023-09-13 DIAGNOSIS — I350 Nonrheumatic aortic (valve) stenosis: Secondary | ICD-10-CM

## 2023-09-13 DIAGNOSIS — I358 Other nonrheumatic aortic valve disorders: Secondary | ICD-10-CM

## 2023-09-13 NOTE — Progress Notes (Signed)
 Cardiothoracic Surgery Consultation  PCP is Regino Slater, MD Referring Provider is Bernie Lamar PARAS, MD Primary cardiologist: Dr. Edwyna  Chief Complaint  Patient presents with   Consult    CTA 10/17, ECHO 1/6, cath 1/6    HPI: The patient is a 63 year old gentleman with a history of prostate cancer and bicuspid aortic valve disease who had a CT of the chest on 06/16/2023 showing a 4.5 cm fusiform ascending aortic aneurysm.  He was referred to our office in December by his PCP and saw one of the PAs on 08/01/2023.  He heard a loud systolic murmur and our PA was concerned about the possibility of bicuspid aortic valve disease.  He was referred to cardiology and saw Dr. Edwyna on 08/18/2023.  He had a calcium  scoring CT on 08/25/2023 showing a coronary calcium  score of 0.  There was a 4.8 cm fusiform ascending aortic aneurysm and an aortic valve calcium  score of 4387.  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  There was severe aortic stenosis with a mean gradient of 45.4 and a peak gradient of 74.4.  Valve area by VTI was 0.84 cm.  Left ventricular ejection fraction was 60 to 65% with LV dimensions.  He subsequently had a TEE on 09/05/2023 which showed a bicuspid aortic valve with fusion of the right and noncoronary cusps with a calcified raphae and bulky calcifications.  The mean gradient across the valve is measured at 28 mmHg with a dimensionless index of 0.2.  The ascending aorta was measured at 4.6 cm.  The aortic root measured 3.5 cm.  He subsequently had cardiac catheterization done on 09/05/2023 showing no coronary disease.  Right heart pressures were normal.  Cardiac index was 2.9.  The patient is here today with his wife.  He works as a education administrator in insurance claims handler.  He denies any chest pressure or discomfort.  He has no exertional fatigue or shortness of breath.  Denies  dizziness.  Has had no orthopnea or peripheral edema.  There is no family history of bicuspid aortic valve, aneurysm, or aortic dissection but he said that his brother does have a heart murmur.  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   Elevated liver enzymes last week   Gastritis, acute 12/22/2013   GERD (gastroesophageal reflux disease)    ocassional   Headache    Heart murmur    Migraine 12/19/2013   Tachycardia 12/19/2013   Transaminitis 12/19/2013    Past Surgical History:  Procedure Laterality Date   CHOLECYSTECTOMY  05/28/11    lap chole    DISTAL BICEPS TENDON REPAIR Right 05/27/2021   Procedure: Right elbow distal biceps repair, possible allograft reconstruction;  Surgeon: Alyse Agent, MD;  Location: [facility];  Service: Orthopedics;  Laterality: Right;   ESOPHAGOGASTRODUODENOSCOPY N/A 12/21/2013   Procedure: ESOPHAGOGASTRODUODENOSCOPY (EGD);  Surgeon: Agent LITTIE Celestia Mickey., MD;  Location: [facility];  Service: Endoscopy;  Laterality: N/A;   EUS N/A 12/04/2014   Procedure: ESOPHAGEAL ENDOSCOPIC ULTRASOUND (EUS) RADIAL;  Surgeon: Elsie Cree, MD;  Location: [facility];  Service: Endoscopy;  Laterality: N/A;   RIGHT HEART CATH AND CORONARY ANGIOGRAPHY N/A 09/05/2023   Procedure: RIGHT HEART CATH AND CORONARY ANGIOGRAPHY;  Surgeon: Elmira Penman  J, MD;  Location: [facility];  Service: Cardiovascular;  Laterality: N/A;   TRANSESOPHAGEAL ECHOCARDIOGRAM (CATH LAB) N/A 09/05/2023   Procedure: TRANSESOPHAGEAL ECHOCARDIOGRAM;  Surgeon: Loni Soyla LABOR, MD;  Location: [facility];  Service: Cardiovascular;  Laterality: N/A;    Family History  Problem Relation Age of Onset   Cancer Mother        kidney    Social History Social History   Tobacco Use   Smoking status: Never   Smokeless tobacco: Never  Vaping Use   Vaping  status: Never Used  Substance Use Topics   Alcohol use: Yes    Alcohol/week: 3.0 standard drinks of alcohol    Types: 3 Cans of beer per week    Comment: occasional   Drug use: No    Current Outpatient Medications  Medication Sig Dispense Refill   tamsulosin  (FLOMAX ) 0.4 MG CAPS capsule Take 0.4 mg by mouth daily.     No current facility-administered medications for this visit.    Allergies  Allergen Reactions   Penicillins Rash    Reaction: 30 years    Review of Systems  Constitutional: Negative.   HENT: Negative.         Sees his dentist every 6 months  Eyes: Negative.   Respiratory:  Negative for shortness of breath.   Cardiovascular:  Negative for chest pain, palpitations and leg swelling.  Gastrointestinal: Negative.   Endocrine: Negative.   Genitourinary: Negative.   Musculoskeletal: Negative.   Skin: Negative.   Allergic/Immunologic: Negative.   Neurological:  Negative for dizziness and syncope.  Hematological: Negative.   Psychiatric/Behavioral: Negative.      BP (!) 146/83   Pulse (!) 104   Resp 18   Ht 5' 6 (1.676 m)   Wt 170 lb (77.1 kg)   SpO2 98% Comment: RA  BMI 27.44 kg/m  Physical Exam Constitutional:      Appearance: Normal appearance. He is normal weight.  HENT:     Head: Normocephalic and atraumatic.  Eyes:     Extraocular Movements: Extraocular movements intact.     Conjunctiva/sclera: Conjunctivae normal.     Pupils: Pupils are equal, round, and reactive to light.  Neck:     Comments: Transmitted murmur to both sides of the neck. Cardiovascular:     Rate and Rhythm: Normal rate and regular rhythm.     Pulses: Normal pulses.     Heart sounds: Murmur heard.     Comments: 3/6 systolic murmur along the right sternal border.  There is no diastolic murmur. Pulmonary:     Effort: Pulmonary effort is normal.     Breath sounds: Normal breath sounds.  Musculoskeletal:        General: No swelling.     Cervical back: Normal range of  motion and neck supple.  Skin:    General: Skin is warm and dry.  Neurological:     General: No focal deficit present.     Mental Status: He is alert and oriented to person, place, and time.  Psychiatric:        Mood and Affect: Mood normal.        Behavior: Behavior normal.      Diagnostic Tests:   ECHOCARDIOGRAM REPORT       Patient Name:   Douglas Cole Date of Exam: 08/19/2023 Medical Rec #:  [id number]    Height:       67.0 in Accession #:    [id number]  Weight:       173.2 lb Date of Birth:  Jan 23, 1961    BSA:          1.902 m Patient Age:    62 years     BP:           136/78 mmHg Patient Gender: M            HR:           81 bpm. Exam Location:  [place]  Procedure: 2D Echo, Cardiac Doppler, Color Doppler and Strain Analysis  Indications:    Aneurysm of ascending aorta without rupture (HCC) [I71.21                 (ICD-10-CM)]; Heart murmur [R01.1 (ICD-10-CM)]   History:        Patient has no prior history of Echocardiogram examinations.                 Arrythmias:Tachycardia; Signs/Symptoms:Murmur.   Sonographer:    Charlie Jointer RDCS Referring Phys: CYRUS JENNIFER SAUNDERS [facility]  IMPRESSIONS    1. Left ventricular ejection fraction, by estimation, is 60 to 65%. The left ventricle has normal function. The left ventricle has no regional wall motion abnormalities. Left ventricular diastolic parameters were normal. The average left ventricular global longitudinal strain is -17.2 %. The global longitudinal strain is normal.  2. Right ventricular systolic function is normal. The right ventricular size is normal. There is normal pulmonary artery systolic pressure.  3. The mitral valve is normal in structure. No evidence of mitral valve regurgitation. No evidence of mitral stenosis.  4. Large calcified mass attached to rt coronary cusp of the aortic valve is noted measuring 21x12 mm. This most likely represents large atheroma.. The aortic valve is bicuspid. There is  severe calcifcation of the aortic valve. Aortic valve regurgitation  is not visualized. Severe aortic valve stenosis. Aortic valve area, by VTI measures 0.84 cm. Aortic valve mean gradient measures 45.4 mmHg.  5. Aneurysm of the ascending aorta, measuring 45 mm.  6. The inferior vena cava is normal in size with greater than 50% respiratory variability, suggesting right atrial pressure of 3 mmHg.  FINDINGS  Left Ventricle: Left ventricular ejection fraction, by estimation, is 60 to 65%. The left ventricle has normal function. The left ventricle has no regional wall motion abnormalities. The average left ventricular global longitudinal strain is -17.2 %. The global longitudinal strain is normal. The left ventricular internal cavity size was normal in size. There is no left ventricular hypertrophy. Left ventricular diastolic parameters were normal.  Right Ventricle: The right ventricular size is normal. No increase in right ventricular wall thickness. Right ventricular systolic function is normal. There is normal pulmonary artery systolic pressure. The tricuspid regurgitant velocity is 2.62 m/s, and  with an assumed right atrial pressure of 3 mmHg, the estimated right ventricular systolic pressure is 30.5 mmHg.  Left Atrium: Left atrial size was normal in size.  Right Atrium: Right atrial size was normal in size.  Pericardium: There is no evidence of pericardial effusion.  Mitral Valve: The mitral valve is normal in structure. No evidence of mitral valve regurgitation. No evidence of mitral valve stenosis.  Tricuspid Valve: The tricuspid valve is normal in structure. Tricuspid valve regurgitation is mild . No evidence of tricuspid stenosis.  Aortic Valve: Large calcified mass attached to rt coronary cusp of the aortic valve is noted measuring 21x12 mm. This most likely represents large atheroma. The aortic valve is bicuspid. There  is severe calcifcation of the aortic valve.  Aortic valve regurgitation is not visualized. Severe aortic stenosis is present. Aortic valve mean gradient measures 45.4 mmHg. Aortic valve peak gradient measures 74.4 mmHg. Aortic valve area, by VTI measures 0.84 cm.  Pulmonic Valve: The pulmonic valve was normal in structure. Pulmonic valve regurgitation is not visualized. No evidence of pulmonic stenosis.  Aorta: The aortic root is normal in size and structure. There is an aneurysm involving the ascending aorta measuring 45 mm.  Venous: The inferior vena cava is normal in size with greater than 50% respiratory variability, suggesting right atrial pressure of 3 mmHg.  IAS/Shunts: No atrial level shunt detected by color flow Doppler.    LEFT VENTRICLE PLAX 2D LVIDd:         4.60 cm   Diastology LVIDs:         2.90 cm   LV e' medial:    10.10 cm/s LV PW:         1.00 cm   LV E/e' medial:  8.1 LV IVS:        1.00 cm   LV e' lateral:   10.60 cm/s LVOT diam:     2.20 cm   LV E/e' lateral: 7.7 LV SV:         76 LV SV Index:   40        2D Longitudinal Strain LVOT Area:     3.80 cm  2D Strain GLS Avg:     -17.2 %    RIGHT VENTRICLE             IVC RV Basal diam:  3.70 cm     IVC diam: 1.90 cm RV Mid diam:    3.10 cm RV S prime:     16.80 cm/s TAPSE (M-mode): 2.8 cm  LEFT ATRIUM             Index        RIGHT ATRIUM           Index LA diam:        3.40 cm 1.79 cm/m   RA Area:     22.30 cm LA Vol (A2C):   57.3 ml 30.12 ml/m  RA Volume:   60.20 ml  31.65 ml/m LA Vol (A4C):   56.4 ml 29.65 ml/m LA Biplane Vol: 56.8 ml 29.86 ml/m  AORTIC VALVE AV Area (Vmax):    0.81 cm AV Area (Vmean):   0.76 cm AV Area (VTI):     0.84 cm AV Vmax:           431.40 cm/s AV Vmean:          315.800 cm/s AV VTI:            0.907 m AV Peak Grad:      74.4 mmHg AV Mean Grad:      45.4 mmHg LVOT Vmax:         92.03 cm/s LVOT Vmean:        63.067 cm/s LVOT VTI:          0.200 m LVOT/AV VTI ratio: 0.22   AORTA Ao Root diam: 3.20  cm Ao Asc diam:  4.50 cm  MITRAL VALVE               TRICUSPID VALVE MV Area (PHT): 4.52 cm    TR Peak grad:   27.5 mmHg MV Decel Time: 168 msec    TR Vmax:  262.00 cm/s MV E velocity: 81.90 cm/s MV A velocity: 81.23 cm/s  SHUNTS MV E/A ratio:  1.01        Systemic VTI:  0.20 m                            Systemic Diam: 2.20 cm  Lamar Fitch MD Electronically signed by Lamar Fitch MD Signature Date/Time: 08/19/2023/5:17:57 PM       Final      Physicians  Panel Physicians Referring Physician Case Authorizing Physician  Elmira Newman PARAS, MD (Primary)     Procedures  RIGHT HEART CATH AND CORONARY ANGIOGRAPHY   Conclusion  Coronary angiography 09/04/2022: LM: Normal LAD: Normal Lcx: Normal RCA: Normal   Right heart catheterization 09/05/2023: RA: 5 mmHg RV: 44/0 mmHg PA: 31/15 mmHg, mPAP 24 mmHg PCW: 11 mmHg   AO sats: 95% PA sats: 73%   CO: 5.4 L/min CI: 2.9 L/min/m2   Continue management of severe aortic stenosis outpatient   Newman PARAS Elmira, MD     Surgeon Notes    09/05/2023 12:39 PM CV Procedure addendum by Loni Soyla LABOR, MD   Indications  Severe aortic stenosis [I35.0 (ICD-10-CM)]   Clinical Presentation  CHF/Shock Congestive heart failure not present. No shock present.   Procedural Details  Technical Details Procedures: 1. Ultrasound guided radial artery access 2. Right heart catheterization 3. Selective left and right coronary angiography  Indication: Severe aortic stenosis  History: 63 year old male with hyperlipidemia, reasonable to ascending aorta, severe aortic stenosis   Diagnostic Angiography: Catheter/s advances over guidewire under fluoroscopy 5 Fr TIG   Pressures tracings obtained in right atrium, right ventricle, pulmonary artery, and pulmonary capillary wedge position. Invasive hemodynamic measurements performed using Fick method.     Anticoagulation:  4000 units heparin   Hemostasis: TR  band  Total contrast used: 25 cc   Total fluoro time: 3.5 min Air Kerma: 78 mGy   All wires and catheters removed out of the body at the end of the procedure Final angiogram showed no dissection/perforation.   Manish PARAS Elmira, MD          Estimated blood loss <50 mL.   During this procedure no sedation was administered.   Medications (Filter: Administrations occurring from 1347 to 1433 on 09/05/23) Heparin  (Porcine) in NaCl 1000-0.9 UT/500ML-% SOLN (mL)  Total volume: 1,000 mL Date/Time Rate/Dose/Volume Action   09/05/23 1349 500 mL Given   1349 500 mL Given   lidocaine  (PF) (XYLOCAINE ) 1 % injection (mL)  Total volume: 4 mL Date/Time Rate/Dose/Volume Action   09/05/23 1406 2 mL Given   1407 2 mL Given   Radial Cocktail/Verapamil  only (mL)  Total volume: 10 mL Date/Time Rate/Dose/Volume Action   09/05/23 1407 10 mL Given    Radiation/Fluoro  Fluoro time: 3.5 (min) DAP: 5126 (mGycm2) Cumulative Air Kerma: 78 (mGy) Complications  Complications documented before study signed (09/06/2023  6:46 AM)   No complications were associated with this study.  Documented by Daril Dress, RT - 09/05/2023  2:30 PM     Coronary Findings  Diagnostic Dominance: Right Left Main  Vessel is normal in caliber. Vessel is angiographically normal.    Left Anterior Descending  Vessel is normal in caliber. Vessel is angiographically normal.    Left Circumflex  Vessel is normal in caliber. Vessel is angiographically normal.    Right Coronary Artery  Vessel is normal in caliber. Vessel is angiographically normal.    Intervention  No interventions have been documented.   Right Heart  Right Heart Pressures Right heart catheterization 09/05/2023: RA: 5 mmHg RV: 44/0 mmHg PA: 31/15 mmHg, mPAP 24 mmHg PCW: 11 mmHg  AO sats: 95% PA sats: 73%  CO: 5.4 L/min CI: 2.9 L/min/m2   Coronary Diagrams  Diagnostic Dominance: Right  Intervention   Implants   No  implant documentation for this case.   Syngo Images   Show images for CARDIAC CATHETERIZATION Images on Long Term Storage   Show images for Foy, Vanduyne to Procedure Log  Procedure Log    Hemodynamics  Pressures Phases Resting  Right     RA Mean  mmHg 5    RA A-Wave  mmHg 8    RA V-Wave  mmHg 6  Pulmonary     PA  mmHg 32/15 (24)    PCW Mean  mmHg 11.0    PCW A-Wave  mmHg 18.0    PCW V-Wave  mmHg 14.0    PAPi   3.4    Saturations Phases Resting    PA  % 73    Arterial  % 95    Cardiac Output and Resistance Phases Resting  Thermo     CO  L/min 5    CI  L/min/m2 2.7  Resistance     Thermo PVR  WU 2.6    Thermo PVR  (dyne x sec)/cm5 208    Hemo Data  Flowsheet Row Most Recent Value  Fick Cardiac Output 5.35 L/min  Fick Cardiac Output Index 2.91 (L/min)/BSA  RA A Wave 8 mmHg  RA V Wave 6 mmHg  RA Mean 5 mmHg  RV Systolic Pressure 44 mmHg  RV Diastolic Pressure -5 mmHg  RV EDP 9 mmHg  PA Systolic Pressure 32 mmHg  PA Diastolic Pressure 15 mmHg  PA Mean 24 mmHg  PW A Wave 18 mmHg  PW V Wave 14 mmHg  PW Mean 11 mmHg  QP/QS 1  TPVR Index 8.26 HRUI   Narrative & Impression  CLINICAL DATA:  Aneurysm.  History of prostate cancer.   EXAM: CT ANGIOGRAPHY CHEST WITH CONTRAST   TECHNIQUE: Multidetector CT imaging of the chest was performed using the standard protocol during bolus administration of intravenous contrast. Multiplanar CT image reconstructions and MIPs were obtained to evaluate the vascular anatomy.   RADIATION DOSE REDUCTION: This exam was performed according to the departmental dose-optimization program which includes automated exposure control, adjustment of the mA and/or kV according to patient size and/or use of iterative reconstruction technique.   CONTRAST:  75mL ISOVUE -370 IOPAMIDOL  (ISOVUE -370) INJECTION 76%   COMPARISON:  10/16/2022.   FINDINGS: Cardiovascular: The heart is normal in size and there is no pericardial  effusion. Calcifications are noted at the aortic valve. There is aneurysmal dilatation of the ascending aorta measuring 4.5 cm. No dissection is seen. Pulmonary trunk is normal in caliber.   Mediastinum/Nodes: No enlarged mediastinal, hilar, or axillary lymph nodes. Thyroid gland, trachea, and esophagus demonstrate no significant findings.   Lungs/Pleura: Lungs are clear. No pleural effusion or pneumothorax.   Upper Abdomen: Pneumobilia is noted. The gallbladder is surgically absent. No acute abnormality.   Musculoskeletal: Mild degenerative changes are present in the thoracic spine. No acute osseous abnormality.   Review of the MIP images confirms the above findings.   IMPRESSION: Calcification of the aortic valve with aneurysmal dilatation of the ascending aorta measuring 4.5 cm. Ascending thoracic aortic aneurysm. Recommend semi-annual imaging followup by CTA or MRA and  referral to cardiothoracic surgery if not already obtained. This recommendation follows 2010 ACCF/AHA/AATS/ACR/ASA/SCA/SCAI/SIR/STS/SVM Guidelines for the Diagnosis and Management of Patients With Thoracic Aortic Disease. Circulation. 2010; 121: [id number]. Aortic aneurysm NOS (ICD10-I71.9)     Electronically Signed   By: Leita Birmingham M.D.   On: 06/24/2023 04:01       Impression:  This 63 year old gentleman has stage C, asymptomatic, severe bicuspid aortic valve stenosis with a 4.5 cm fusiform ascending aortic aneurysm extending from the sinotubular junction to the takeoff of the innominate artery.  I think the best treatment is to proceed with open surgical aortic valve replacement using a bioprosthetic valve and replacement of the ascending aorta from the sinotubular junction to the takeoff of the innominate artery.  This will require a period of hypothermic circulatory arrest.  I think the risk of continuing to follow this is that he will develop left ventricular dysfunction and possibly further enlargement  of the ascending aorta or acute aortic dissection.  I reviewed the echo, cath, and CT images with the patient and his wife and answered their questions.  I discussed the reasons that TAVR is not an option for treating this relatively young patient with a severely calcified bicuspid aortic valve and an ascending aortic aneurysm.  I discussed the operative procedure using a bioprosthetic valve. I discussed the alternatives, benefits and risks; including but not limited to bleeding, blood transfusion, infection, stroke, myocardial infarction, graft failure, heart block requiring a permanent pacemaker, organ dysfunction, and death.  Douglas Cole understands and agrees to proceed.     Plan:  He is going to think about the timing of surgery.  I do not think there is an urgency to proceed with surgery since he is asymptomatic but I think it should be fixed within the next few months to minimize his risk.  I spent 60 minutes performing this consultation and > 50% of this time was spent face to face counseling and coordinating the care of this patient's severe bicuspid aortic valve stenosis and ascending aortic aneurysm.  Dorise MARLA Fellers, MD [facility] [phone]

## 2023-09-26 ENCOUNTER — Other Ambulatory Visit: Payer: Self-pay | Admitting: *Deleted

## 2023-09-26 ENCOUNTER — Encounter: Payer: Self-pay | Admitting: *Deleted

## 2023-09-26 DIAGNOSIS — I350 Nonrheumatic aortic (valve) stenosis: Secondary | ICD-10-CM

## 2023-09-26 DIAGNOSIS — I7121 Aneurysm of the ascending aorta, without rupture: Secondary | ICD-10-CM

## 2023-10-19 NOTE — Pre-Procedure Instructions (Signed)
 Surgical Instructions   Your procedure is scheduled on October 24, 2023. Report to [facility] Main Entrance "A" at 5:30 A.M., then check in with the Admitting office. Any questions or running late day of surgery: call [phone]  Questions prior to your surgery date: call [phone], Monday-Friday, 8am-4pm. If you experience any cold or flu symptoms such as cough, fever, chills, shortness of breath, etc. between now and your scheduled surgery, please notify us at the above number.     Remember:  Do not eat or drink after midnight the night before your surgery   Take these medicines the morning of surgery with A SIP OF WATER: tamsulosin (FLOMAX)    One week prior to surgery, STOP taking any Aspirin (unless otherwise instructed by your surgeon) Aleve, Naproxen, Ibuprofen, Motrin, Advil, Goody's, BC's, all herbal medications, fish oil, and non-prescription vitamins.                     Do NOT Smoke (Tobacco/Vaping) for 24 hours prior to your procedure.  If you use a CPAP at night, you may bring your mask/headgear for your overnight stay.   You will be asked to remove any contacts, glasses, piercing's, hearing aid's, dentures/partials prior to surgery. Please bring cases for these items if needed.    Patients discharged the day of surgery will not be allowed to drive home, and someone needs to stay with them for 24 hours.  SURGICAL WAITING ROOM VISITATION Patients may have no more than 2 support people in the waiting area - these visitors may rotate.   Pre-op nurse will coordinate an appropriate time for 1 ADULT support person, who may not rotate, to accompany patient in pre-op.  Children under the age of 106 must have an adult with them who is not the patient and must remain in the main waiting area with an adult.  If the patient needs to stay at the hospital during part of their recovery, the visitor guidelines for inpatient rooms apply.  Please refer to the [facility] website  for the visitor guidelines for any additional information.   If you received a COVID test during your pre-op visit  it is requested that you wear a mask when out in public, stay away from anyone that may not be feeling well and notify your surgeon if you develop symptoms. If you have been in contact with anyone that has tested positive in the last 10 days please notify you surgeon.      Pre-operative CHG Bathing Instructions   You can play a key role in reducing the risk of infection after surgery. Your skin needs to be as free of germs as possible. You can reduce the number of germs on your skin by washing with CHG (chlorhexidine gluconate) soap before surgery. CHG is an antiseptic soap that kills germs and continues to kill germs even after washing.   DO NOT use if you have an allergy to chlorhexidine/CHG or antibacterial soaps. If your skin becomes reddened or irritated, stop using the CHG and notify one of our RNs at [phone].              TAKE A SHOWER THE NIGHT BEFORE SURGERY AND THE DAY OF SURGERY    Please keep in mind the following:  DO NOT shave, including legs and underarms, 48 hours prior to surgery.   You may shave your face before/day of surgery.  Place clean sheets on your bed the night before surgery Use a  clean washcloth (not used since being washed) for each shower. DO NOT sleep with pet's night before surgery.  CHG Shower Instructions:  Wash your face and private area with normal soap. If you choose to wash your hair, wash first with your normal shampoo.  After you use shampoo/soap, rinse your hair and body thoroughly to remove shampoo/soap residue.  Turn the water OFF and apply half the bottle of CHG soap to a CLEAN washcloth.  Apply CHG soap ONLY FROM YOUR NECK DOWN TO YOUR TOES (washing for 3-5 minutes)  DO NOT use CHG soap on face, private areas, open wounds, or sores.  Pay special attention to the area where your surgery is being performed.  If you are having  back surgery, having someone wash your back for you may be helpful. Wait 2 minutes after CHG soap is applied, then you may rinse off the CHG soap.  Pat dry with a clean towel  Put on clean pajamas    Additional instructions for the day of surgery: DO NOT APPLY any lotions, deodorants, cologne, or perfumes.   Do not wear jewelry or makeup Do not wear nail polish, gel polish, artificial nails, or any other type of covering on natural nails (fingers and toes) Do not bring valuables to the hospital. [facility] is not responsible for valuables/personal belongings. Put on clean/comfortable clothes.  Please brush your teeth.  Ask your nurse before applying any prescription medications to the skin.

## 2023-10-20 ENCOUNTER — Ambulatory Visit (HOSPITAL_COMMUNITY)
Admission: RE | Admit: 2023-10-20 | Discharge: 2023-10-20 | Disposition: A | Discharge status: Home / Self Care | Payer: 59 | Source: Ambulatory Visit | Attending: Surgery | Admitting: Surgery

## 2023-10-20 ENCOUNTER — Encounter (HOSPITAL_COMMUNITY)
Admission: RE | Admit: 2023-10-20 | Discharge: 2023-10-20 | Disposition: A | Discharge status: Home / Self Care | Payer: 59 | Source: Ambulatory Visit | Attending: Surgery

## 2023-10-20 ENCOUNTER — Other Ambulatory Visit: Payer: Self-pay

## 2023-10-20 ENCOUNTER — Encounter (HOSPITAL_COMMUNITY): Payer: Self-pay

## 2023-10-20 VITALS — BP 149/89 | HR 83 | Temp 98.0°F | Resp 18 | Ht 66.0 in | Wt 168.7 lb

## 2023-10-20 DIAGNOSIS — Z01810 Encounter for preprocedural cardiovascular examination: Secondary | ICD-10-CM

## 2023-10-20 DIAGNOSIS — I7121 Aneurysm of the ascending aorta, without rupture: Secondary | ICD-10-CM | POA: Diagnosis not present

## 2023-10-20 DIAGNOSIS — I350 Nonrheumatic aortic (valve) stenosis: Secondary | ICD-10-CM | POA: Insufficient documentation

## 2023-10-20 DIAGNOSIS — Z01818 Encounter for other preprocedural examination: Secondary | ICD-10-CM

## 2023-10-20 LAB — COMPREHENSIVE METABOLIC PANEL
ALT: 17 U/L (ref 0–44)
AST: 20 U/L (ref 15–41)
Albumin: 4 g/dL (ref 3.5–5.0)
Alkaline Phosphatase: 33 U/L — ABNORMAL LOW (ref 38–126)
Anion gap: 7 (ref 5–15)
BUN: 15 mg/dL (ref 8–23)
CO2: 28 mmol/L (ref 22–32)
Calcium: 9.1 mg/dL (ref 8.9–10.3)
Chloride: 104 mmol/L (ref 98–111)
Creatinine, Ser: 0.88 mg/dL (ref 0.61–1.24)
GFR, Estimated: >60 mL/min (ref >60)
Glucose, Bld: 104 mg/dL — ABNORMAL HIGH (ref 70–99)
Potassium: 4.3 mmol/L (ref 3.5–5.1)
Sodium: 139 mmol/L (ref 135–145)
Total Bilirubin: 1.4 mg/dL — ABNORMAL HIGH (ref 0.0–1.2)
Total Protein: 7 g/dL (ref 6.5–8.1)

## 2023-10-20 LAB — URINALYSIS, ROUTINE W REFLEX MICROSCOPIC
Bilirubin Urine: NEGATIVE
Glucose, UA: NEGATIVE mg/dL
Hgb urine dipstick: NEGATIVE
Ketones, ur: NEGATIVE mg/dL
Leukocytes,Ua: NEGATIVE
Nitrite: NEGATIVE
Protein, ur: NEGATIVE mg/dL
Specific Gravity, Urine: 1.006 (ref 1.005–1.030)
pH: 6 (ref 5.0–8.0)

## 2023-10-20 LAB — PROTIME-INR
INR: 1 (ref 0.8–1.2)
Prothrombin Time: 13.8 s (ref 11.4–15.2)

## 2023-10-20 LAB — APTT: aPTT: 29 s (ref 24–36)

## 2023-10-20 LAB — CBC
HCT: 43.5 % (ref 39.0–52.0)
Hemoglobin: 14.9 g/dL (ref 13.0–17.0)
MCH: 29.2 pg (ref 26.0–34.0)
MCHC: 34.3 g/dL (ref 30.0–36.0)
MCV: 85.3 fL (ref 80.0–100.0)
Platelets: 155 10*3/uL (ref 150–400)
RBC: 5.1 MIL/uL (ref 4.22–5.81)
RDW: 12.8 % (ref 11.5–15.5)
WBC: 4.6 10*3/uL (ref 4.0–10.5)
nRBC: 0 % (ref 0.0–0.2)

## 2023-10-20 LAB — SURGICAL PCR SCREEN
MRSA, PCR: NEGATIVE
Staphylococcus aureus: NEGATIVE

## 2023-10-20 LAB — HEMOGLOBIN A1C
Hgb A1c MFr Bld: 5.2 % (ref 4.8–5.6)
Mean Plasma Glucose: 102.54 mg/dL

## 2023-10-20 NOTE — Progress Notes (Signed)
 PCP - Dr. Carilyn Goodpasture [place] Cardiologist - Dr. Belva Crome - Last office visit 08/30/2023  PPM/ICD - Denies Device Orders - n/a Rep Notified - n/a  Chest x-ray - 10/20/2023 EKG - 10/20/2023 Stress Test - Denies ECHO - 09/05/2023 Cardiac Cath - 09/05/2023  Sleep Study - Denies CPAP - n/a  No DM  Last dose of GLP1 agonist- n/a GLP1 instructions: n/a  Blood Thinner Instructions: n/a Aspirin Instructions: n/a  NPO after midnight  COVID TEST- n/a   Anesthesia review: Yes. Borderline EKG review.  Patient denies shortness of breath, fever, cough and chest pain at PAT appointment. Pt endorses a cold around January 30th. Symptoms included scratchy throat and head congestion, but it never moved into a chest congestion/URI and he was not on antibiotics. Symptoms lasted for about three days and he has had symptom resolution for at least two weeks.   All instructions explained to the patient, with a verbal understanding of the material. Patient agrees to go over the instructions while at home for a better understanding. Patient also instructed to self quarantine after being tested for COVID-19. The opportunity to ask questions was provided.

## 2023-10-21 MED ORDER — VANCOMYCIN HCL 1250 MG/250ML IV SOLN
1250.0000 mg | INTRAVENOUS | Status: AC
  Administered 2023-10-24: 1250 mg via INTRAVENOUS
  Filled 2023-10-21: qty 250

## 2023-10-21 MED ORDER — TRANEXAMIC ACID (OHS) BOLUS VIA INFUSION
15.0000 mg/kg | INTRAVENOUS | Status: AC
  Administered 2023-10-24: 1147.5 mg via INTRAVENOUS
  Filled 2023-10-21: qty 1148

## 2023-10-21 MED ORDER — NITROGLYCERIN IN D5W 200-5 MCG/ML-% IV SOLN
2.0000 ug/min | INTRAVENOUS | Status: AC
  Administered 2023-10-24: 15 ug/min via INTRAVENOUS
  Filled 2023-10-21: qty 250

## 2023-10-21 MED ORDER — INSULIN REGULAR(HUMAN) IN NACL 100-0.9 UT/100ML-% IV SOLN
INTRAVENOUS | Status: DC
  Filled 2023-10-21: qty 100

## 2023-10-21 MED ORDER — PHENYLEPHRINE HCL-NACL 20-0.9 MG/250ML-% IV SOLN
30.0000 ug/min | INTRAVENOUS | Status: DC
Start: 2023-10-24 — End: 2023-10-24
  Filled 2023-10-21: qty 250

## 2023-10-21 MED ORDER — CEFAZOLIN SODIUM-DEXTROSE 2-4 GM/100ML-% IV SOLN
2.0000 g | INTRAVENOUS | Status: AC
Start: 2023-10-24 — End: 2023-10-24
  Administered 2023-10-24 (×2): 2 g via INTRAVENOUS
  Filled 2023-10-21: qty 100

## 2023-10-21 MED ORDER — CEFAZOLIN SODIUM-DEXTROSE 2-4 GM/100ML-% IV SOLN
2.0000 g | INTRAVENOUS | Status: DC
  Filled 2023-10-21: qty 100

## 2023-10-21 MED ORDER — DEXMEDETOMIDINE HCL IN NACL 400 MCG/100ML IV SOLN
0.1000 ug/kg/h | INTRAVENOUS | Status: DC
  Filled 2023-10-21: qty 100

## 2023-10-21 MED ORDER — NOREPINEPHRINE 4 MG/250ML-% IV SOLN
0.0000 ug/min | INTRAVENOUS | Status: DC
Start: 2023-10-24 — End: 2023-10-24
  Filled 2023-10-21: qty 250

## 2023-10-21 MED ORDER — MANNITOL 20 % IV SOLN
INTRAVENOUS | Status: DC
  Filled 2023-10-21: qty 13

## 2023-10-21 MED ORDER — TRANEXAMIC ACID 1000 MG/10ML IV SOLN
1.5000 mg/kg/h | INTRAVENOUS | Status: AC
  Administered 2023-10-24: 1.5 mg/kg/h via INTRAVENOUS
  Filled 2023-10-21: qty 25

## 2023-10-21 MED ORDER — MILRINONE LACTATE IN DEXTROSE 20-5 MG/100ML-% IV SOLN
0.3000 ug/kg/min | INTRAVENOUS | Status: DC
  Filled 2023-10-21: qty 100

## 2023-10-21 MED ORDER — TRANEXAMIC ACID (OHS) PUMP PRIME SOLUTION
2.0000 mg/kg | INTRAVENOUS | Status: DC
  Filled 2023-10-21: qty 1.53

## 2023-10-21 MED ORDER — EPINEPHRINE HCL 5 MG/250ML IV SOLN IN NS
0.0000 ug/min | INTRAVENOUS | Status: DC
  Filled 2023-10-21: qty 250

## 2023-10-21 MED ORDER — POTASSIUM CHLORIDE 2 MEQ/ML IV SOLN
80.0000 meq | INTRAVENOUS | Status: DC
  Filled 2023-10-21: qty 40

## 2023-10-21 MED ORDER — HEPARIN 30,000 UNITS/1000 ML (OHS) CELLSAVER SOLUTION
Status: DC
  Filled 2023-10-21: qty 1000

## 2023-10-21 MED ORDER — PLASMA-LYTE A IV SOLN
INTRAVENOUS | Status: DC
  Filled 2023-10-21: qty 2.5

## 2023-10-22 NOTE — H&P (Signed)
 [address]       [place] [address]             [phone]      Cardiothoracic Surgery Admission History and Physical   PCP is Darrow Bussing, MD Referring Provider is Georgeanna Lea, MD Primary cardiologist: Dr. Tomie China       Chief Complaint  Patient presents with   Severe bicuspid aortic valve stenosis and ascending aortic aneurysm           HPI: The patient is a 63 year old gentleman with a history of prostate cancer and bicuspid aortic valve disease who had a CT of the chest on 06/16/2023 showing a 4.5 cm fusiform ascending aortic aneurysm.  He was referred to our office in December by his PCP and saw one of the PAs on 08/01/2023.  He heard a loud systolic murmur and our PA was concerned about the possibility of bicuspid aortic valve disease.  He was referred to cardiology and saw Dr. Tomie China on 08/18/2023.  He had a calcium scoring CT on 08/25/2023 showing a coronary calcium score of 0.  There was a 4.8 cm fusiform ascending aortic aneurysm and an aortic valve calcium score of 4387.  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  There was severe aortic stenosis with a mean gradient of 45.4 and a peak gradient of 74.4.  Valve area by VTI was 0.84 cm.  Left ventricular ejection fraction was 60 to 65% with LV dimensions.  He subsequently had a TEE on 09/05/2023 which showed a bicuspid aortic valve with fusion of the right and noncoronary cusps with a calcified raphae and bulky calcifications.  The mean gradient across the valve is measured at 28 mmHg with a dimensionless index of 0.2.  The ascending aorta was measured at 4.6 cm.  The aortic root measured 3.5 cm.  He subsequently had cardiac catheterization done on 09/05/2023 showing no coronary disease.  Right heart pressures were normal.  Cardiac index was 2.9.   The patient is married and lives with  his wife.  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  He denies any chest pressure or discomfort.  He has no exertional fatigue or shortness of breath.  Denies dizziness.  Has had no orthopnea or peripheral edema.  There is no family history of bicuspid aortic valve, aneurysm, or aortic dissection but he said that his brother does have a heart murmur.       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   Elevated liver enzymes last week   Gastritis, acute 12/22/2013   GERD (gastroesophageal reflux disease)      ocassional   Headache     Heart murmur     Migraine 12/19/2013   Tachycardia 12/19/2013   Transaminitis 12/19/2013               Past Surgical History:  Procedure Laterality Date   CHOLECYSTECTOMY   05/28/11     lap chole    DISTAL BICEPS TENDON REPAIR Right 05/27/2021    Procedure: Right elbow distal biceps repair, possible allograft reconstruction;  Surgeon: Gomez Cleverly, MD;  Location: [facility] OR;  Service: Orthopedics;  Laterality: Right;  ESOPHAGOGASTRODUODENOSCOPY N/A 12/21/2013    Procedure: ESOPHAGOGASTRODUODENOSCOPY (EGD);  Surgeon: Vertell Novak., MD;  Location: [facility];  Service: Endoscopy;  Laterality: N/A;   EUS N/A 12/04/2014    Procedure: ESOPHAGEAL ENDOSCOPIC ULTRASOUND (EUS) RADIAL;  Surgeon: Willis Modena, MD;  Location: [facility];  Service: Endoscopy;  Laterality: N/A;   RIGHT HEART CATH AND CORONARY ANGIOGRAPHY N/A 09/05/2023    Procedure: RIGHT HEART CATH AND CORONARY ANGIOGRAPHY;  Surgeon: Elder Negus, MD;  Location: [facility];  Service: Cardiovascular;  Laterality: N/A;   TRANSESOPHAGEAL ECHOCARDIOGRAM (CATH LAB) N/A 09/05/2023    Procedure: TRANSESOPHAGEAL ECHOCARDIOGRAM;  Surgeon: Parke Poisson, MD;  Location: [facility];  Service: Cardiovascular;  Laterality: N/A;               Family  History  Problem Relation Age of Onset   Cancer Mother          kidney          Social History Social History  Social History         Tobacco Use   Smoking status: Never   Smokeless tobacco: Never  Vaping Use   Vaping status: Never Used  Substance Use Topics   Alcohol use: Yes      Alcohol/week: 3.0 standard drinks of alcohol      Types: 3 Cans of beer per week      Comment: occasional   Drug use: No              Current Outpatient Medications  Medication Sig Dispense Refill   tamsulosin (FLOMAX) 0.4 MG CAPS capsule Take 0.4 mg by mouth daily.          No current facility-administered medications for this visit.        Allergies       Allergies  Allergen Reactions   Penicillins Rash      Reaction: 30 years        Review of Systems  Constitutional: Negative.   HENT: Negative.         Sees his dentist every 6 months  Eyes: Negative.   Respiratory:  Negative for shortness of breath.   Cardiovascular:  Negative for chest pain, palpitations and leg swelling.  Gastrointestinal: Negative.   Endocrine: Negative.   Genitourinary: Negative.   Musculoskeletal: Negative.   Skin: Negative.   Allergic/Immunologic: Negative.   Neurological:  Negative for dizziness and syncope.  Hematological: Negative.   Psychiatric/Behavioral: Negative.        BP (!) 146/83   Pulse (!) 104   Resp 18   Ht 5\' 6"  (1.676 m)   Wt 170 lb (77.1 kg)   SpO2 98% Comment: RA  BMI 27.44 kg/m  Physical Exam Constitutional:      Appearance: Normal appearance. He is normal weight.  HENT:     Head: Normocephalic and atraumatic.  Eyes:     Extraocular Movements: Extraocular movements intact.     Conjunctiva/sclera: Conjunctivae normal.     Pupils: Pupils are equal, round, and reactive to light.  Neck:     Comments: Transmitted murmur to both sides of the neck. Cardiovascular:     Rate and Rhythm: Normal rate and regular rhythm.     Pulses: Normal pulses.     Heart sounds: Murmur  heard.     Comments: 3/6 systolic murmur along the right sternal border.  There is no diastolic murmur. Pulmonary:     Effort: Pulmonary effort is normal.  Breath sounds: Normal breath sounds.  Musculoskeletal:        General: No swelling.     Cervical back: Normal range of motion and neck supple.  Skin:    General: Skin is warm and dry.  Neurological:     General: No focal deficit present.     Mental Status: He is alert and oriented to person, place, and time.  Psychiatric:        Mood and Affect: Mood normal.        Behavior: Behavior normal.          Diagnostic Tests:    ECHOCARDIOGRAM REPORT       Patient Name:   STEPHENSON CICHY Date of Exam: 08/19/2023 Medical Rec #:  [id number]    Height:       67.0 in Accession #:    [id number]   Weight:       173.2 lb Date of Birth:  12/19/1960    BSA:          1.902 m Patient Age:    62 years     BP:           136/78 mmHg Patient Gender: M            HR:           81 bpm. Exam Location:  [facility]  Procedure: 2D Echo, Cardiac Doppler, Color Doppler and Strain Analysis  Indications:    Aneurysm of ascending aorta without rupture (HCC) [I71.21                 (ICD-10-CM)]; Heart murmur [R01.1 (ICD-10-CM)]   History:        Patient has no prior history of Echocardiogram examinations.                 Arrythmias:Tachycardia; Signs/Symptoms:Murmur.   Sonographer:    Margreta Journey RDCS Referring Phys: Rito Ehrlich [facility]  IMPRESSIONS    1. Left ventricular ejection fraction, by estimation, is 60 to 65%. The left ventricle has normal function. The left ventricle has no regional wall motion abnormalities. Left ventricular diastolic parameters were normal. The average left ventricular global longitudinal strain is -17.2 %. The global longitudinal strain is normal.  2. Right ventricular systolic function is normal. The right ventricular size is normal. There is normal pulmonary artery systolic pressure.  3. The mitral valve  is normal in structure. No evidence of mitral valve regurgitation. No evidence of mitral stenosis.  4. Large calcified mass attached to rt coronary cusp of the aortic valve is noted measuring 21x12 mm. This most likely represents large atheroma.. The aortic valve is bicuspid. There is severe calcifcation of the aortic valve. Aortic valve regurgitation  is not visualized. Severe aortic valve stenosis. Aortic valve area, by VTI measures 0.84 cm. Aortic valve mean gradient measures 45.4 mmHg.  5. Aneurysm of the ascending aorta, measuring 45 mm.  6. The inferior vena cava is normal in size with greater than 50% respiratory variability, suggesting right atrial pressure of 3 mmHg.  FINDINGS  Left Ventricle: Left ventricular ejection fraction, by estimation, is 60 to 65%. The left ventricle has normal function. The left ventricle has no regional wall motion abnormalities. The average left ventricular global longitudinal strain is -17.2 %. The global longitudinal strain is normal. The left ventricular internal cavity size was normal in size. There is no left ventricular hypertrophy. Left ventricular diastolic parameters were normal.  Right Ventricle: The right ventricular size is normal. No  increase in right ventricular wall thickness. Right ventricular systolic function is normal. There is normal pulmonary artery systolic pressure. The tricuspid regurgitant velocity is 2.62 m/s, and  with an assumed right atrial pressure of 3 mmHg, the estimated right ventricular systolic pressure is 30.5 mmHg.  Left Atrium: Left atrial size was normal in size.  Right Atrium: Right atrial size was normal in size.  Pericardium: There is no evidence of pericardial effusion.  Mitral Valve: The mitral valve is normal in structure. No evidence of mitral valve regurgitation. No evidence of mitral valve stenosis.  Tricuspid Valve: The tricuspid valve is normal in structure. Tricuspid valve regurgitation is  mild . No evidence of tricuspid stenosis.  Aortic Valve: Large calcified mass attached to rt coronary cusp of the aortic valve is noted measuring 21x12 mm. This most likely represents large atheroma. The aortic valve is bicuspid. There is severe calcifcation of the aortic valve. Aortic valve regurgitation is not visualized. Severe aortic stenosis is present. Aortic valve mean gradient measures 45.4 mmHg. Aortic valve peak gradient measures 74.4 mmHg. Aortic valve area, by VTI measures 0.84 cm.  Pulmonic Valve: The pulmonic valve was normal in structure. Pulmonic valve regurgitation is not visualized. No evidence of pulmonic stenosis.  Aorta: The aortic root is normal in size and structure. There is an aneurysm involving the ascending aorta measuring 45 mm.  Venous: The inferior vena cava is normal in size with greater than 50% respiratory variability, suggesting right atrial pressure of 3 mmHg.  IAS/Shunts: No atrial level shunt detected by color flow Doppler.    LEFT VENTRICLE PLAX 2D LVIDd:         4.60 cm   Diastology LVIDs:         2.90 cm   LV e' medial:    10.10 cm/s LV PW:         1.00 cm   LV E/e' medial:  8.1 LV IVS:        1.00 cm   LV e' lateral:   10.60 cm/s LVOT diam:     2.20 cm   LV E/e' lateral: 7.7 LV SV:         76 LV SV Index:   40        2D Longitudinal Strain LVOT Area:     3.80 cm  2D Strain GLS Avg:     -17.2 %    RIGHT VENTRICLE             IVC RV Basal diam:  3.70 cm     IVC diam: 1.90 cm RV Mid diam:    3.10 cm RV S prime:     16.80 cm/s TAPSE (M-mode): 2.8 cm  LEFT ATRIUM             Index        RIGHT ATRIUM           Index LA diam:        3.40 cm 1.79 cm/m   RA Area:     22.30 cm LA Vol (A2C):   57.3 ml 30.12 ml/m  RA Volume:   60.20 ml  31.65 ml/m LA Vol (A4C):   56.4 ml 29.65 ml/m LA Biplane Vol: 56.8 ml 29.86 ml/m  AORTIC VALVE AV Area (Vmax):    0.81 cm AV Area (Vmean):   0.76 cm AV Area (VTI):     0.84 cm AV Vmax:            431.40 cm/s AV Vmean:  315.800 cm/s AV VTI:            0.907 m AV Peak Grad:      74.4 mmHg AV Mean Grad:      45.4 mmHg LVOT Vmax:         92.03 cm/s LVOT Vmean:        63.067 cm/s LVOT VTI:          0.200 m LVOT/AV VTI ratio: 0.22   AORTA Ao Root diam: 3.20 cm Ao Asc diam:  4.50 cm  MITRAL VALVE               TRICUSPID VALVE MV Area (PHT): 4.52 cm    TR Peak grad:   27.5 mmHg MV Decel Time: 168 msec    TR Vmax:        262.00 cm/s MV E velocity: 81.90 cm/s MV A velocity: 81.23 cm/s  SHUNTS MV E/A ratio:  1.01        Systemic VTI:  0.20 m                            Systemic Diam: 2.20 cm  Gypsy Balsam MD Electronically signed by Gypsy Balsam MD Signature Date/Time: 08/19/2023/5:17:57 PM       Final        Physicians   Panel Physicians Referring Physician Case Authorizing Physician  Elder Negus, MD (Primary)        Procedures   RIGHT HEART CATH AND CORONARY ANGIOGRAPHY    Conclusion   Coronary angiography 09/04/2022: LM: Normal LAD: Normal Lcx: Normal RCA: Normal   Right heart catheterization 09/05/2023: RA: 5 mmHg RV: 44/0 mmHg PA: 31/15 mmHg, mPAP 24 mmHg PCW: 11 mmHg   AO sats: 95% PA sats: 73%   CO: 5.4 L/min CI: 2.9 L/min/m2   Continue management of severe aortic stenosis outpatient   Elder Negus, MD     Surgeon Notes       09/05/2023 12:39 PM CV Procedure addendum by Parke Poisson, MD    Indications   Severe aortic stenosis [I35.0 (ICD-10-CM)]    Clinical Presentation   CHF/Shock Congestive heart failure not present. No shock present.    Procedural Details   Technical Details Procedures: 1. Ultrasound guided radial artery access 2. Right heart catheterization 3. Selective left and right coronary angiography  Indication: Severe aortic stenosis  History: 63 year old male with hyperlipidemia, reasonable to ascending aorta, severe aortic stenosis   Diagnostic Angiography: Catheter/s advances  over guidewire under fluoroscopy 5 Fr TIG   Pressures tracings obtained in right atrium, right ventricle, pulmonary artery, and pulmonary capillary wedge position. Invasive hemodynamic measurements performed using Fick method.     Anticoagulation:  4000 units heparin  Hemostasis: TR band  Total contrast used: 25 cc   Total fluoro time: 3.5 min Air Kerma: 78 mGy   All wires and catheters removed out of the body at the end of the procedure Final angiogram showed no dissection/perforation.   Manish Emiliano Dyer, MD          Estimated blood loss <50 mL.   During this procedure no sedation was administered.    Medications (Filter: Administrations occurring from 1347 to 1433 on 09/05/23) Heparin (Porcine) in NaCl 1000-0.9 UT/500ML-% SOLN (mL)  Total volume: 1,000 mL Date/Time Rate/Dose/Volume Action    09/05/23 1349 500 mL Given    1349 500 mL Given    lidocaine (PF) (XYLOCAINE) 1 % injection (  mL)  Total volume: 4 mL Date/Time Rate/Dose/Volume Action    09/05/23 1406 2 mL Given    1407 2 mL Given    Radial Cocktail/Verapamil only (mL)  Total volume: 10 mL Date/Time Rate/Dose/Volume Action    09/05/23 1407 10 mL Given      Radiation/Fluoro   Fluoro time: 3.5 (min) DAP: 5126 (mGycm2) Cumulative Air Kerma: 78 (mGy) Complications   Complications documented before study signed (09/06/2023  6:46 AM)    No complications were associated with this study.  Documented by Ancil Linsey, RT - 09/05/2023  2:30 PM      Coronary Findings   Diagnostic Dominance: Right Left Main  Vessel is normal in caliber. Vessel is angiographically normal.    Left Anterior Descending  Vessel is normal in caliber. Vessel is angiographically normal.    Left Circumflex  Vessel is normal in caliber. Vessel is angiographically normal.    Right Coronary Artery  Vessel is normal in caliber. Vessel is angiographically normal.    Intervention    No interventions have been  documented.    Right Heart   Right Heart Pressures Right heart catheterization 09/05/2023: RA: 5 mmHg RV: 44/0 mmHg PA: 31/15 mmHg, mPAP 24 mmHg PCW: 11 mmHg  AO sats: 95% PA sats: 73%  CO: 5.4 L/min CI: 2.9 L/min/m2    Coronary Diagrams   Diagnostic Dominance: Right  Intervention    Implants    No implant documentation for this case.    Syngo Images    Show images for CARDIAC CATHETERIZATION Images on Long Term Storage    Show images for Juanantonio, Stolar to Procedure Log   Procedure Log    Hemodynamics   Pressures Phases Resting  Right      RA Mean  mmHg 5    RA A-Wave  mmHg 8    RA V-Wave  mmHg 6  Pulmonary      PA  mmHg 32/15 (24)    PCW Mean  mmHg 11.0    PCW A-Wave  mmHg 18.0    PCW V-Wave  mmHg 14.0    PAPi   3.4    Saturations Phases Resting    PA  % 73    Arterial  % 95    Cardiac Output and Resistance Phases Resting  Thermo      CO  L/min 5    CI  L/min/m2 2.7  Resistance      Thermo PVR  WU 2.6    Thermo PVR  (dyne x sec)/cm5 208    Hemo Data   Flowsheet Row Most Recent Value  Fick Cardiac Output 5.35 L/min  Fick Cardiac Output Index 2.91 (L/min)/BSA  RA A Wave 8 mmHg  RA V Wave 6 mmHg  RA Mean 5 mmHg  RV Systolic Pressure 44 mmHg  RV Diastolic Pressure -5 mmHg  RV EDP 9 mmHg  PA Systolic Pressure 32 mmHg  PA Diastolic Pressure 15 mmHg  PA Mean 24 mmHg  PW A Wave 18 mmHg  PW V Wave 14 mmHg  PW Mean 11 mmHg  QP/QS 1  TPVR Index 8.26 HRUI    Narrative & Impression  CLINICAL DATA:  Aneurysm.  History of prostate cancer.   EXAM: CT ANGIOGRAPHY CHEST WITH CONTRAST   TECHNIQUE: Multidetector CT imaging of the chest was performed using the standard protocol during bolus administration of intravenous contrast. Multiplanar CT image reconstructions and MIPs were obtained to evaluate the vascular anatomy.   RADIATION DOSE REDUCTION:  This exam was performed according to the departmental dose-optimization program which  includes automated exposure control, adjustment of the mA and/or kV according to patient size and/or use of iterative reconstruction technique.   CONTRAST:  75mL ISOVUE-370 IOPAMIDOL (ISOVUE-370) INJECTION 76%   COMPARISON:  10/16/2022.   FINDINGS: Cardiovascular: The heart is normal in size and there is no pericardial effusion. Calcifications are noted at the aortic valve. There is aneurysmal dilatation of the ascending aorta measuring 4.5 cm. No dissection is seen. Pulmonary trunk is normal in caliber.   Mediastinum/Nodes: No enlarged mediastinal, hilar, or axillary lymph nodes. Thyroid gland, trachea, and esophagus demonstrate no significant findings.   Lungs/Pleura: Lungs are clear. No pleural effusion or pneumothorax.   Upper Abdomen: Pneumobilia is noted. The gallbladder is surgically absent. No acute abnormality.   Musculoskeletal: Mild degenerative changes are present in the thoracic spine. No acute osseous abnormality.   Review of the MIP images confirms the above findings.   IMPRESSION: Calcification of the aortic valve with aneurysmal dilatation of the ascending aorta measuring 4.5 cm. Ascending thoracic aortic aneurysm. Recommend semi-annual imaging followup by CTA or MRA and referral to cardiothoracic surgery if not already obtained. This recommendation follows 2010 ACCF/AHA/AATS/ACR/ASA/SCA/SCAI/SIR/STS/SVM Guidelines for the Diagnosis and Management of Patients With Thoracic Aortic Disease. Circulation. 2010; 121: V956-L875. Aortic aneurysm NOS (ICD10-I71.9)     Electronically Signed   By: Thornell Sartorius M.D.   On: 06/24/2023 04:01          Impression:   This 63 year old gentleman has stage C, asymptomatic, severe bicuspid aortic valve stenosis with a 4.5 cm fusiform ascending aortic aneurysm extending from the sinotubular junction to the takeoff of the innominate artery.  I think the best treatment is to proceed with open surgical aortic valve  replacement using a bioprosthetic valve and replacement of the ascending aorta from the sinotubular junction to the takeoff of the innominate artery.  This will require a period of hypothermic circulatory arrest.  I think the risk of continuing to follow this is that he will develop left ventricular dysfunction and possibly further enlargement of the ascending aorta or acute aortic dissection.  I reviewed the echo, cath, and CT images with the patient and his wife and answered their questions.  I discussed the reasons that TAVR is not an option for treating this relatively young patient with a severely calcified bicuspid aortic valve and an ascending aortic aneurysm.  I discussed the operative procedure using a bioprosthetic valve. I discussed the alternatives, benefits and risks; including but not limited to bleeding, blood transfusion, infection, stroke, myocardial infarction, graft failure, heart block requiring a permanent pacemaker, organ dysfunction, and death.  [name] understands and agrees to proceed.       Plan:   Aortic valve replacement using a bioprosthetic valve and replacement of ascending aorta using hypothermic circulatory arrest.   Alleen Borne, MD [facility] [phone]

## 2023-10-24 ENCOUNTER — Other Ambulatory Visit: Payer: Self-pay | Admitting: Cardiology

## 2023-10-24 ENCOUNTER — Inpatient Hospital Stay (HOSPITAL_COMMUNITY): Payer: 59

## 2023-10-24 ENCOUNTER — Other Ambulatory Visit: Payer: Self-pay

## 2023-10-24 ENCOUNTER — Inpatient Hospital Stay (HOSPITAL_COMMUNITY): Payer: 59 | Admitting: Certified Registered Nurse Anesthetist

## 2023-10-24 ENCOUNTER — Inpatient Hospital Stay (HOSPITAL_COMMUNITY): Payer: 59 | Admitting: Physician Assistant

## 2023-10-24 ENCOUNTER — Inpatient Hospital Stay (HOSPITAL_COMMUNITY)
Admission: RE | Admit: 2023-10-24 | Discharge: 2023-10-29 | DRG: 220 | Disposition: A | Discharge status: Home Health | Payer: 59 | Attending: Surgery | Admitting: Surgery

## 2023-10-24 ENCOUNTER — Inpatient Hospital Stay (HOSPITAL_COMMUNITY)
Admission: RE | Disposition: A | Discharge status: Home Health | Payer: Self-pay | Source: Home / Self Care | Attending: Surgery

## 2023-10-24 DIAGNOSIS — Z8679 Personal history of other diseases of the circulatory system: Principal | ICD-10-CM

## 2023-10-24 DIAGNOSIS — Z9049 Acquired absence of other specified parts of digestive tract: Secondary | ICD-10-CM | POA: Diagnosis not present

## 2023-10-24 DIAGNOSIS — K219 Gastro-esophageal reflux disease without esophagitis: Secondary | ICD-10-CM | POA: Diagnosis present

## 2023-10-24 DIAGNOSIS — Z79899 Other long term (current) drug therapy: Secondary | ICD-10-CM | POA: Diagnosis not present

## 2023-10-24 DIAGNOSIS — Y846 Urinary catheterization as the cause of abnormal reaction of the patient, or of later complication, without mention of misadventure at the time of the procedure: Secondary | ICD-10-CM | POA: Diagnosis not present

## 2023-10-24 DIAGNOSIS — R338 Other retention of urine: Secondary | ICD-10-CM | POA: Diagnosis present

## 2023-10-24 DIAGNOSIS — I350 Nonrheumatic aortic (valve) stenosis: Secondary | ICD-10-CM

## 2023-10-24 DIAGNOSIS — Z880 Allergy status to penicillin: Secondary | ICD-10-CM

## 2023-10-24 DIAGNOSIS — Z8546 Personal history of malignant neoplasm of prostate: Secondary | ICD-10-CM | POA: Diagnosis not present

## 2023-10-24 DIAGNOSIS — R310 Gross hematuria: Secondary | ICD-10-CM | POA: Diagnosis not present

## 2023-10-24 DIAGNOSIS — I4940 Unspecified premature depolarization: Secondary | ICD-10-CM | POA: Diagnosis not present

## 2023-10-24 DIAGNOSIS — F419 Anxiety disorder, unspecified: Secondary | ICD-10-CM | POA: Diagnosis present

## 2023-10-24 DIAGNOSIS — Z9889 Other specified postprocedural states: Principal | ICD-10-CM

## 2023-10-24 DIAGNOSIS — Q2381 Bicuspid aortic valve: Secondary | ICD-10-CM

## 2023-10-24 DIAGNOSIS — T8383XA Hemorrhage of genitourinary prosthetic devices, implants and grafts, initial encounter: Secondary | ICD-10-CM | POA: Diagnosis not present

## 2023-10-24 DIAGNOSIS — I7121 Aneurysm of the ascending aorta, without rupture: Secondary | ICD-10-CM | POA: Diagnosis present

## 2023-10-24 DIAGNOSIS — Z952 Presence of prosthetic heart valve: Principal | ICD-10-CM

## 2023-10-24 DIAGNOSIS — N401 Enlarged prostate with lower urinary tract symptoms: Secondary | ICD-10-CM | POA: Diagnosis present

## 2023-10-24 DIAGNOSIS — I712 Thoracic aortic aneurysm, without rupture, unspecified: Secondary | ICD-10-CM | POA: Diagnosis not present

## 2023-10-24 DIAGNOSIS — D6959 Other secondary thrombocytopenia: Secondary | ICD-10-CM | POA: Diagnosis not present

## 2023-10-24 HISTORY — PX: AORTIC VALVE REPLACEMENT: SHX41

## 2023-10-24 HISTORY — PX: TEE WITHOUT CARDIOVERSION: SHX5443

## 2023-10-24 HISTORY — PX: REPLACEMENT ASCENDING AORTA: SHX6068

## 2023-10-24 LAB — POCT I-STAT, CHEM 8
BUN: 13 mg/dL (ref 8–23)
BUN: 12 mg/dL (ref 8–23)
BUN: 13 mg/dL (ref 8–23)
BUN: 14 mg/dL (ref 8–23)
BUN: 14 mg/dL (ref 8–23)
BUN: 13 mg/dL (ref 8–23)
Calcium, Ion: 1.17 mmol/L (ref 1.15–1.40)
Calcium, Ion: 0.91 mmol/L — ABNORMAL LOW (ref 1.15–1.40)
Calcium, Ion: 1.17 mmol/L (ref 1.15–1.40)
Calcium, Ion: 1 mmol/L — ABNORMAL LOW (ref 1.15–1.40)
Calcium, Ion: 0.99 mmol/L — ABNORMAL LOW (ref 1.15–1.40)
Calcium, Ion: 1.2 mmol/L (ref 1.15–1.40)
Chloride: 106 mmol/L (ref 98–111)
Chloride: 105 mmol/L (ref 98–111)
Chloride: 95 mmol/L — ABNORMAL LOW (ref 98–111)
Chloride: 100 mmol/L (ref 98–111)
Chloride: 102 mmol/L (ref 98–111)
Chloride: 104 mmol/L (ref 98–111)
Creatinine, Ser: 0.6 mg/dL — ABNORMAL LOW (ref 0.61–1.24)
Creatinine, Ser: 0.6 mg/dL — ABNORMAL LOW (ref 0.61–1.24)
Creatinine, Ser: 0.6 mg/dL — ABNORMAL LOW (ref 0.61–1.24)
Creatinine, Ser: 0.7 mg/dL (ref 0.61–1.24)
Creatinine, Ser: 0.7 mg/dL (ref 0.61–1.24)
Creatinine, Ser: 0.6 mg/dL — ABNORMAL LOW (ref 0.61–1.24)
Glucose, Bld: 101 mg/dL — ABNORMAL HIGH (ref 70–99)
Glucose, Bld: 101 mg/dL — ABNORMAL HIGH (ref 70–99)
Glucose, Bld: 118 mg/dL — ABNORMAL HIGH (ref 70–99)
Glucose, Bld: 145 mg/dL — ABNORMAL HIGH (ref 70–99)
Glucose, Bld: 141 mg/dL — ABNORMAL HIGH (ref 70–99)
Glucose, Bld: 136 mg/dL — ABNORMAL HIGH (ref 70–99)
HCT: 33 % — ABNORMAL LOW (ref 39.0–52.0)
HCT: 26 % — ABNORMAL LOW (ref 39.0–52.0)
HCT: 33 % — ABNORMAL LOW (ref 39.0–52.0)
HCT: 26 % — ABNORMAL LOW (ref 39.0–52.0)
HCT: 26 % — ABNORMAL LOW (ref 39.0–52.0)
HCT: 29 % — ABNORMAL LOW (ref 39.0–52.0)
Hemoglobin: 11.2 g/dL — ABNORMAL LOW (ref 13.0–17.0)
Hemoglobin: 11.2 g/dL — ABNORMAL LOW (ref 13.0–17.0)
Hemoglobin: 8.8 g/dL — ABNORMAL LOW (ref 13.0–17.0)
Hemoglobin: 8.8 g/dL — ABNORMAL LOW (ref 13.0–17.0)
Hemoglobin: 8.8 g/dL — ABNORMAL LOW (ref 13.0–17.0)
Hemoglobin: 9.9 g/dL — ABNORMAL LOW (ref 13.0–17.0)
Potassium: 3.5 mmol/L (ref 3.5–5.1)
Potassium: 3.9 mmol/L (ref 3.5–5.1)
Potassium: 4.3 mmol/L (ref 3.5–5.1)
Potassium: 5.6 mmol/L — ABNORMAL HIGH (ref 3.5–5.1)
Potassium: 5.9 mmol/L — ABNORMAL HIGH (ref 3.5–5.1)
Potassium: 4.7 mmol/L (ref 3.5–5.1)
Sodium: 141 mmol/L (ref 135–145)
Sodium: 131 mmol/L — ABNORMAL LOW (ref 135–145)
Sodium: 140 mmol/L (ref 135–145)
Sodium: 133 mmol/L — ABNORMAL LOW (ref 135–145)
Sodium: 135 mmol/L (ref 135–145)
Sodium: 137 mmol/L (ref 135–145)
TCO2: 26 mmol/L (ref 22–32)
TCO2: 24 mmol/L (ref 22–32)
TCO2: 25 mmol/L (ref 22–32)
TCO2: 26 mmol/L (ref 22–32)
TCO2: 26 mmol/L (ref 22–32)
TCO2: 26 mmol/L (ref 22–32)

## 2023-10-24 LAB — POCT I-STAT 7, (LYTES, BLD GAS, ICA,H+H)
Acid-Base Excess: 0 mmol/L (ref 0.0–2.0)
Acid-Base Excess: 0 mmol/L (ref 0.0–2.0)
Acid-base deficit: 3 mmol/L — ABNORMAL HIGH (ref 0.0–2.0)
Bicarbonate: 23.4 mmol/L (ref 20.0–28.0)
Bicarbonate: 23.4 mmol/L (ref 20.0–28.0)
Bicarbonate: 24.3 mmol/L (ref 20.0–28.0)
Calcium, Ion: 0.96 mmol/L — ABNORMAL LOW (ref 1.15–1.40)
Calcium, Ion: 1.05 mmol/L — ABNORMAL LOW (ref 1.15–1.40)
Calcium, Ion: 1.22 mmol/L (ref 1.15–1.40)
HCT: 27 % — ABNORMAL LOW (ref 39.0–52.0)
HCT: 26 % — ABNORMAL LOW (ref 39.0–52.0)
HCT: 26 % — ABNORMAL LOW (ref 39.0–52.0)
Hemoglobin: 9.2 g/dL — ABNORMAL LOW (ref 13.0–17.0)
Hemoglobin: 8.8 g/dL — ABNORMAL LOW (ref 13.0–17.0)
Hemoglobin: 8.8 g/dL — ABNORMAL LOW (ref 13.0–17.0)
O2 Saturation: 100 %
O2 Saturation: 100 %
O2 Saturation: 100 %
Potassium: 4.9 mmol/L (ref 3.5–5.1)
Potassium: 5.7 mmol/L — ABNORMAL HIGH (ref 3.5–5.1)
Potassium: 4.7 mmol/L (ref 3.5–5.1)
Sodium: 139 mmol/L (ref 135–145)
Sodium: 135 mmol/L (ref 135–145)
Sodium: 137 mmol/L (ref 135–145)
TCO2: 24 mmol/L (ref 22–32)
TCO2: 25 mmol/L (ref 22–32)
TCO2: 25 mmol/L (ref 22–32)
pCO2 arterial: 33.5 mmHg (ref 32–48)
pCO2 arterial: 44.6 mmHg (ref 32–48)
pCO2 arterial: 39.3 mmHg (ref 32–48)
pH, Arterial: 7.453 — ABNORMAL HIGH (ref 7.35–7.45)
pH, Arterial: 7.327 — ABNORMAL LOW (ref 7.35–7.45)
pH, Arterial: 7.4 (ref 7.35–7.45)
pO2, Arterial: 437 mmHg — ABNORMAL HIGH (ref 83–108)
pO2, Arterial: 480 mmHg — ABNORMAL HIGH (ref 83–108)
pO2, Arterial: 372 mmHg — ABNORMAL HIGH (ref 83–108)

## 2023-10-24 LAB — POCT I-STAT EG7
Acid-base deficit: 2 mmol/L (ref 0.0–2.0)
Bicarbonate: 22.4 mmol/L (ref 20.0–28.0)
Calcium, Ion: 0.97 mmol/L — ABNORMAL LOW (ref 1.15–1.40)
HCT: 25 % — ABNORMAL LOW (ref 39.0–52.0)
Hemoglobin: 8.5 g/dL — ABNORMAL LOW (ref 13.0–17.0)
O2 Saturation: 79 %
Potassium: 4.1 mmol/L (ref 3.5–5.1)
Sodium: 142 mmol/L (ref 135–145)
TCO2: 23 mmol/L (ref 22–32)
pCO2, Ven: 36.4 mmHg — ABNORMAL LOW (ref 44–60)
pH, Ven: 7.397 (ref 7.25–7.43)
pO2, Ven: 43 mmHg (ref 32–45)

## 2023-10-24 LAB — PROTIME-INR
INR: 2.6 — ABNORMAL HIGH (ref 0.8–1.2)
Prothrombin Time: 28.2 s — ABNORMAL HIGH (ref 11.4–15.2)

## 2023-10-24 LAB — CBC
HCT: 26.6 % — ABNORMAL LOW (ref 39.0–52.0)
HCT: 26.9 % — ABNORMAL LOW (ref 39.0–52.0)
Hemoglobin: 9.2 g/dL — ABNORMAL LOW (ref 13.0–17.0)
Hemoglobin: 9.4 g/dL — ABNORMAL LOW (ref 13.0–17.0)
MCH: 30.1 pg (ref 26.0–34.0)
MCH: 30.1 pg (ref 26.0–34.0)
MCHC: 34.6 g/dL (ref 30.0–36.0)
MCHC: 34.9 g/dL (ref 30.0–36.0)
MCV: 86.9 fL (ref 80.0–100.0)
MCV: 86.2 fL (ref 80.0–100.0)
Platelets: 101 10*3/uL — ABNORMAL LOW (ref 150–400)
Platelets: 84 10*3/uL — ABNORMAL LOW (ref 150–400)
RBC: 3.06 MIL/uL — ABNORMAL LOW (ref 4.22–5.81)
RBC: 3.12 MIL/uL — ABNORMAL LOW (ref 4.22–5.81)
RDW: 13.1 % (ref 11.5–15.5)
RDW: 13.1 % (ref 11.5–15.5)
WBC: 11.4 10*3/uL — ABNORMAL HIGH (ref 4.0–10.5)
WBC: 7.8 10*3/uL (ref 4.0–10.5)
nRBC: 0 % (ref 0.0–0.2)
nRBC: 0 % (ref 0.0–0.2)

## 2023-10-24 LAB — BASIC METABOLIC PANEL
Anion gap: 8 (ref 5–15)
BUN: 15 mg/dL (ref 8–23)
CO2: 24 mmol/L (ref 22–32)
Calcium: 8 mg/dL — ABNORMAL LOW (ref 8.9–10.3)
Chloride: 107 mmol/L (ref 98–111)
Creatinine, Ser: 0.8 mg/dL (ref 0.61–1.24)
GFR, Estimated: >60 mL/min (ref >60)
Glucose, Bld: 135 mg/dL — ABNORMAL HIGH (ref 70–99)
Potassium: 4.3 mmol/L (ref 3.5–5.1)
Sodium: 139 mmol/L (ref 135–145)

## 2023-10-24 LAB — ECHO INTRAOPERATIVE TEE
AV Mean grad: 18 mmHg
AV Peak grad: 30.5 mmHg
Ao pk vel: 2.76 m/s
Height: 66 in
Weight: 2640 [oz_av]

## 2023-10-24 LAB — FIBRINOGEN: Fibrinogen: 173 mg/dL — ABNORMAL LOW (ref 210–475)

## 2023-10-24 LAB — HEMOGLOBIN AND HEMATOCRIT, BLOOD
HCT: 26.5 % — ABNORMAL LOW (ref 39.0–52.0)
Hemoglobin: 9.4 g/dL — ABNORMAL LOW (ref 13.0–17.0)

## 2023-10-24 LAB — APTT: aPTT: 50 s — ABNORMAL HIGH (ref 24–36)

## 2023-10-24 LAB — PLATELET COUNT: Platelets: 112 10*3/uL — ABNORMAL LOW (ref 150–400)

## 2023-10-24 LAB — MAGNESIUM: Magnesium: 3 mg/dL — ABNORMAL HIGH (ref 1.7–2.4)

## 2023-10-24 SURGERY — REPLACEMENT, AORTIC VALVE, OPEN
Anesthesia: General | Site: Chest

## 2023-10-24 MED ORDER — BISACODYL 5 MG PO TBEC
10.0000 mg | DELAYED_RELEASE_TABLET | Freq: Every day | ORAL | Status: DC
  Administered 2023-10-25 – 2023-10-27 (×3): 10 mg via ORAL
  Filled 2023-10-24 (×3): qty 2

## 2023-10-24 MED ORDER — FENTANYL CITRATE (PF) 250 MCG/5ML IJ SOLN
INTRAMUSCULAR | Status: AC
  Filled 2023-10-24: qty 5

## 2023-10-24 MED ORDER — PROPOFOL 500 MG/50ML IV EMUL
INTRAVENOUS | Status: DC | PRN
  Administered 2023-10-24: 30 ug/kg/min via INTRAVENOUS

## 2023-10-24 MED ORDER — ACETAMINOPHEN 160 MG/5ML PO SOLN: 1000.0000 mg | Freq: Four times a day (QID) | ORAL | Status: DC

## 2023-10-24 MED ORDER — PANTOPRAZOLE SODIUM 40 MG PO TBEC
40.0000 mg | DELAYED_RELEASE_TABLET | Freq: Every day | ORAL | Status: DC
  Administered 2023-10-26 – 2023-10-27 (×2): 40 mg via ORAL
  Filled 2023-10-24 (×4): qty 1

## 2023-10-24 MED ORDER — CLEVIDIPINE BUTYRATE 0.5 MG/ML IV EMUL
INTRAVENOUS | Status: DC | PRN
  Administered 2023-10-24: 2 mg/h via INTRAVENOUS

## 2023-10-24 MED ORDER — MAGNESIUM SULFATE 4 GM/100ML IV SOLN
4.0000 g | Freq: Once | INTRAVENOUS | Status: AC
  Administered 2023-10-24: 4 g via INTRAVENOUS
  Filled 2023-10-24: qty 100

## 2023-10-24 MED ORDER — SODIUM CHLORIDE 0.9% IV SOLUTION: Freq: Once | INTRAVENOUS | Status: DC

## 2023-10-24 MED ORDER — METOPROLOL TARTRATE 5 MG/5ML IV SOLN: 2.5000 mg | INTRAVENOUS | Status: DC | PRN

## 2023-10-24 MED ORDER — SODIUM CHLORIDE 0.9% FLUSH
3.0000 mL | Freq: Two times a day (BID) | INTRAVENOUS | Status: DC
  Administered 2023-10-24: 5 mL via INTRAVENOUS
  Administered 2023-10-25: 3 mL via INTRAVENOUS

## 2023-10-24 MED ORDER — PHENYLEPHRINE 80 MCG/ML (10ML) SYRINGE FOR IV PUSH (FOR BLOOD PRESSURE SUPPORT)
PREFILLED_SYRINGE | INTRAVENOUS | Status: DC | PRN
  Administered 2023-10-24: 160 ug via INTRAVENOUS
  Administered 2023-10-24: 80 ug via INTRAVENOUS
  Administered 2023-10-24: 40 ug via INTRAVENOUS

## 2023-10-24 MED ORDER — SODIUM CHLORIDE 0.9% FLUSH: 3.0000 mL | INTRAVENOUS | Status: DC | PRN

## 2023-10-24 MED ORDER — ALBUMIN HUMAN 5 % IV SOLN: INTRAVENOUS | Status: DC | PRN

## 2023-10-24 MED ORDER — MIDAZOLAM HCL (PF) 10 MG/2ML IJ SOLN
INTRAMUSCULAR | Status: AC
  Filled 2023-10-24: qty 2

## 2023-10-24 MED ORDER — PHENYLEPHRINE HCL (PRESSORS) 10 MG/ML IV SOLN
INTRAVENOUS | Status: DC | PRN
  Administered 2023-10-24: 40 ug via INTRAVENOUS
  Administered 2023-10-24: 160 ug via INTRAVENOUS

## 2023-10-24 MED ORDER — LIDOCAINE 2% (20 MG/ML) 5 ML SYRINGE
INTRAMUSCULAR | Status: AC
  Filled 2023-10-24: qty 5

## 2023-10-24 MED ORDER — ASPIRIN 81 MG PO CHEW
324.0000 mg | CHEWABLE_TABLET | Freq: Once | ORAL | Status: AC
  Administered 2023-10-24: 324 mg via ORAL
  Filled 2023-10-24: qty 4

## 2023-10-24 MED ORDER — BISACODYL 10 MG RE SUPP: 10.0000 mg | Freq: Every day | RECTAL | Status: DC

## 2023-10-24 MED ORDER — SODIUM CHLORIDE 0.9% FLUSH
10.0000 mL | Freq: Two times a day (BID) | INTRAVENOUS | Status: DC
Start: 2023-10-24 — End: 2023-10-27
  Administered 2023-10-24 – 2023-10-27 (×6): 10 mL

## 2023-10-24 MED ORDER — VANCOMYCIN HCL IN DEXTROSE 1-5 GM/200ML-% IV SOLN
1000.0000 mg | Freq: Once | INTRAVENOUS | Status: AC
  Administered 2023-10-24: 1000 mg via INTRAVENOUS
  Filled 2023-10-24: qty 200

## 2023-10-24 MED ORDER — CHLORHEXIDINE GLUCONATE 0.12 % MT SOLN
15.0000 mL | Freq: Once | OROMUCOSAL | Status: AC
Start: 2023-10-25 — End: 2023-10-24
  Administered 2023-10-24: 15 mL via OROMUCOSAL
  Filled 2023-10-24: qty 15

## 2023-10-24 MED ORDER — HEMOSTATIC AGENTS (NO CHARGE) OPTIME
TOPICAL | Status: DC | PRN
  Administered 2023-10-24 (×2): 1 via TOPICAL

## 2023-10-24 MED ORDER — ROCURONIUM BROMIDE 100 MG/10ML IV SOLN
INTRAVENOUS | Status: DC | PRN
  Administered 2023-10-24: 50 mg via INTRAVENOUS
  Administered 2023-10-24: 100 mg via INTRAVENOUS
  Administered 2023-10-24: 20 mg via INTRAVENOUS

## 2023-10-24 MED ORDER — ALBUMIN HUMAN 5 % IV SOLN
250.0000 mL | INTRAVENOUS | Status: DC | PRN
  Administered 2023-10-24: 12.5 g via INTRAVENOUS

## 2023-10-24 MED ORDER — ROCURONIUM BROMIDE 10 MG/ML (PF) SYRINGE
PREFILLED_SYRINGE | INTRAVENOUS | Status: AC
Start: 2023-10-24 — End: ?
  Filled 2023-10-24: qty 20

## 2023-10-24 MED ORDER — ASPIRIN 325 MG PO TBEC: 325.0000 mg | DELAYED_RELEASE_TABLET | Freq: Every day | ORAL | Status: DC

## 2023-10-24 MED ORDER — LACTATED RINGERS IV SOLN: INTRAVENOUS | Status: DC | PRN

## 2023-10-24 MED ORDER — METOCLOPRAMIDE HCL 5 MG/ML IJ SOLN
10.0000 mg | Freq: Four times a day (QID) | INTRAMUSCULAR | Status: AC
  Administered 2023-10-24 – 2023-10-25 (×6): 10 mg via INTRAVENOUS
  Filled 2023-10-24 (×6): qty 2

## 2023-10-24 MED ORDER — HEPARIN SODIUM (PORCINE) 1000 UNIT/ML IJ SOLN
INTRAMUSCULAR | Status: DC | PRN
Start: 2023-10-24 — End: 2023-10-24
  Administered 2023-10-24: 27000 [IU] via INTRAVENOUS

## 2023-10-24 MED ORDER — METOPROLOL TARTRATE 25 MG/10 ML ORAL SUSPENSION: 12.5000 mg | Freq: Two times a day (BID) | ORAL | Status: DC

## 2023-10-24 MED ORDER — INSULIN REGULAR(HUMAN) IN NACL 100-0.9 UT/100ML-% IV SOLN: INTRAVENOUS | Status: DC

## 2023-10-24 MED ORDER — TAMSULOSIN HCL 0.4 MG PO CAPS
0.4000 mg | ORAL_CAPSULE | Freq: Every day | ORAL | Status: DC
  Administered 2023-10-25: 0.4 mg via ORAL
  Filled 2023-10-24 (×2): qty 1

## 2023-10-24 MED ORDER — DEXMEDETOMIDINE HCL IN NACL 400 MCG/100ML IV SOLN
0.0000 ug/kg/h | INTRAVENOUS | Status: DC
  Administered 2023-10-24: 0.5 ug/kg/h via INTRAVENOUS
  Filled 2023-10-24: qty 100

## 2023-10-24 MED ORDER — NITROGLYCERIN IN D5W 200-5 MCG/ML-% IV SOLN: 0.0000 ug/min | INTRAVENOUS | Status: DC

## 2023-10-24 MED ORDER — LIDOCAINE 2% (20 MG/ML) 5 ML SYRINGE
INTRAMUSCULAR | Status: DC | PRN
  Administered 2023-10-24: 100 mg via INTRAVENOUS

## 2023-10-24 MED ORDER — INSULIN REGULAR(HUMAN) IN NACL 100-0.9 UT/100ML-% IV SOLN
INTRAVENOUS | Status: DC | PRN
  Administered 2023-10-24: 1 [IU]/h via INTRAVENOUS

## 2023-10-24 MED ORDER — METOPROLOL TARTRATE 12.5 MG HALF TABLET
12.5000 mg | ORAL_TABLET | Freq: Once | ORAL | Status: AC
Start: 2023-10-24 — End: 2023-10-24
  Administered 2023-10-24: 12.5 mg via ORAL
  Filled 2023-10-24: qty 1

## 2023-10-24 MED ORDER — NOREPINEPHRINE BITARTRATE 1 MG/ML IV SOLN
INTRAVENOUS | Status: DC | PRN
  Administered 2023-10-24: .25 mL via INTRAVENOUS
  Administered 2023-10-24: 1 mL via INTRAVENOUS

## 2023-10-24 MED ORDER — METHYLPREDNISOLONE SODIUM SUCC 125 MG IJ SOLR
INTRAMUSCULAR | Status: DC | PRN
  Administered 2023-10-24: 125 mg via INTRAVENOUS

## 2023-10-24 MED ORDER — SODIUM CHLORIDE 0.9% FLUSH: 10.0000 mL | INTRAVENOUS | Status: DC | PRN

## 2023-10-24 MED ORDER — CEFAZOLIN SODIUM-DEXTROSE 2-4 GM/100ML-% IV SOLN
2.0000 g | Freq: Three times a day (TID) | INTRAVENOUS | Status: AC
  Administered 2023-10-24 – 2023-10-26 (×6): 2 g via INTRAVENOUS
  Filled 2023-10-24 (×6): qty 100

## 2023-10-24 MED ORDER — SODIUM CHLORIDE 0.9 % IV SOLN
20.0000 ug | Freq: Once | INTRAVENOUS | Status: AC
  Administered 2023-10-24: 20 ug via INTRAVENOUS
  Filled 2023-10-24: qty 5

## 2023-10-24 MED ORDER — METHADONE HCL IV SYRINGE 10 MG/ML FOR CABG
0.3000 mg/kg | Freq: Once | INTRAMUSCULAR | Status: AC
  Administered 2023-10-24: 19 mg via INTRAVENOUS
  Filled 2023-10-24: qty 1.9

## 2023-10-24 MED ORDER — THROMBIN (RECOMBINANT) 20000 UNITS EX SOLR
CUTANEOUS | Status: AC
  Filled 2023-10-24: qty 20000

## 2023-10-24 MED ORDER — 0.9 % SODIUM CHLORIDE (POUR BTL) OPTIME
TOPICAL | Status: DC | PRN
  Administered 2023-10-24: 5000 mL

## 2023-10-24 MED ORDER — MORPHINE SULFATE (PF) 2 MG/ML IV SOLN: 1.0000 mg | INTRAVENOUS | Status: DC | PRN

## 2023-10-24 MED ORDER — PROPOFOL 10 MG/ML IV BOLUS
INTRAVENOUS | Status: AC
  Filled 2023-10-24: qty 20

## 2023-10-24 MED ORDER — PROTAMINE SULFATE 10 MG/ML IV SOLN
INTRAVENOUS | Status: AC
  Filled 2023-10-24: qty 5

## 2023-10-24 MED ORDER — PHENYLEPHRINE HCL-NACL 20-0.9 MG/250ML-% IV SOLN: 0.0000 ug/min | INTRAVENOUS | Status: DC

## 2023-10-24 MED ORDER — SODIUM CHLORIDE 0.9 % IV SOLN: INTRAVENOUS | Status: DC

## 2023-10-24 MED ORDER — PHENYLEPHRINE HCL-NACL 20-0.9 MG/250ML-% IV SOLN
INTRAVENOUS | Status: DC | PRN
  Administered 2023-10-24: 30 ug/min via INTRAVENOUS

## 2023-10-24 MED ORDER — SODIUM CHLORIDE 0.9% FLUSH
3.0000 mL | Freq: Two times a day (BID) | INTRAVENOUS | Status: DC
Start: 2023-10-24 — End: 2023-10-27
  Administered 2023-10-24: 5 mL via INTRAVENOUS
  Administered 2023-10-25: 3 mL via INTRAVENOUS

## 2023-10-24 MED ORDER — NITROGLYCERIN 0.2 MG/ML ON CALL CATH LAB
INTRAVENOUS | Status: DC | PRN
  Administered 2023-10-24: 20 ug via INTRAVENOUS
  Administered 2023-10-24 (×3): 40 ug via INTRAVENOUS

## 2023-10-24 MED ORDER — EPHEDRINE 5 MG/ML INJ
INTRAVENOUS | Status: AC
  Filled 2023-10-24: qty 5

## 2023-10-24 MED ORDER — CHLORHEXIDINE GLUCONATE 4 % EX SOLN: 30.0000 mL | CUTANEOUS | Status: DC

## 2023-10-24 MED ORDER — CLEVIDIPINE BUTYRATE 0.5 MG/ML IV EMUL
INTRAVENOUS | Status: AC
  Filled 2023-10-24: qty 50

## 2023-10-24 MED ORDER — MIDAZOLAM HCL 2 MG/2ML IJ SOLN: 2.0000 mg | INTRAMUSCULAR | Status: DC | PRN

## 2023-10-24 MED ORDER — THROMBIN 20000 UNITS EX SOLR: OROMUCOSAL | Status: DC | PRN

## 2023-10-24 MED ORDER — METOPROLOL TARTRATE 12.5 MG HALF TABLET
12.5000 mg | ORAL_TABLET | Freq: Two times a day (BID) | ORAL | Status: DC
Start: 2023-10-24 — End: 2023-10-26
  Administered 2023-10-25 (×2): 12.5 mg via ORAL
  Filled 2023-10-24 (×2): qty 1

## 2023-10-24 MED ORDER — EPHEDRINE SULFATE-NACL 50-0.9 MG/10ML-% IV SOSY
PREFILLED_SYRINGE | INTRAVENOUS | Status: DC | PRN
  Administered 2023-10-24: 5 mg via INTRAVENOUS

## 2023-10-24 MED ORDER — PROPOFOL 1000 MG/100ML IV EMUL
INTRAVENOUS | Status: AC
  Filled 2023-10-24: qty 100

## 2023-10-24 MED ORDER — HEPARIN SODIUM (PORCINE) 1000 UNIT/ML IJ SOLN
INTRAMUSCULAR | Status: AC
  Filled 2023-10-24: qty 10

## 2023-10-24 MED ORDER — DEXTROSE 50 % IV SOLN: 0.0000 mL | INTRAVENOUS | Status: DC | PRN

## 2023-10-24 MED ORDER — ~~LOC~~ CARDIAC SURGERY, PATIENT & FAMILY EDUCATION
Freq: Once | Status: DC
  Filled 2023-10-24: qty 1

## 2023-10-24 MED ORDER — TRAMADOL HCL 50 MG PO TABS: 50.0000 mg | ORAL_TABLET | ORAL | Status: DC | PRN

## 2023-10-24 MED ORDER — ACETAMINOPHEN 160 MG/5ML PO SOLN
650.0000 mg | Freq: Once | ORAL | Status: AC
  Administered 2023-10-24: 650 mg
  Filled 2023-10-24: qty 20.3

## 2023-10-24 MED ORDER — ONDANSETRON HCL 4 MG/2ML IJ SOLN
4.0000 mg | Freq: Four times a day (QID) | INTRAMUSCULAR | Status: DC | PRN
  Administered 2023-10-26: 4 mg via INTRAVENOUS
  Filled 2023-10-24: qty 2

## 2023-10-24 MED ORDER — PLASMA-LYTE A IV SOLN: INTRAVENOUS | Status: DC | PRN

## 2023-10-24 MED ORDER — PROTAMINE SULFATE 10 MG/ML IV SOLN
INTRAVENOUS | Status: DC | PRN
  Administered 2023-10-24: 20 mg via INTRAVENOUS
  Administered 2023-10-24: 250 mg via INTRAVENOUS

## 2023-10-24 MED ORDER — POTASSIUM CHLORIDE 10 MEQ/50ML IV SOLN
10.0000 meq | INTRAVENOUS | Status: AC
  Administered 2023-10-24 (×3): 10 meq via INTRAVENOUS

## 2023-10-24 MED ORDER — DOCUSATE SODIUM 100 MG PO CAPS
200.0000 mg | ORAL_CAPSULE | Freq: Every day | ORAL | Status: DC
  Administered 2023-10-25 – 2023-10-27 (×3): 200 mg via ORAL
  Filled 2023-10-24 (×3): qty 2

## 2023-10-24 MED ORDER — FENTANYL CITRATE (PF) 100 MCG/2ML IJ SOLN
INTRAMUSCULAR | Status: DC | PRN
  Administered 2023-10-24: 100 ug via INTRAVENOUS
  Administered 2023-10-24: 150 ug via INTRAVENOUS
  Administered 2023-10-24: 50 ug via INTRAVENOUS

## 2023-10-24 MED ORDER — PHENYLEPHRINE 80 MCG/ML (10ML) SYRINGE FOR IV PUSH (FOR BLOOD PRESSURE SUPPORT)
PREFILLED_SYRINGE | INTRAVENOUS | Status: AC
  Filled 2023-10-24: qty 10

## 2023-10-24 MED ORDER — SUGAMMADEX SODIUM 200 MG/2ML IV SOLN
INTRAVENOUS | Status: AC
Start: 2023-10-24 — End: ?
  Filled 2023-10-24: qty 2

## 2023-10-24 MED ORDER — ACETAMINOPHEN 500 MG PO TABS
1000.0000 mg | ORAL_TABLET | Freq: Four times a day (QID) | ORAL | Status: DC
  Administered 2023-10-25 – 2023-10-27 (×12): 1000 mg via ORAL
  Administered 2023-10-28: 500 mg via ORAL
  Administered 2023-10-28 – 2023-10-29 (×6): 1000 mg via ORAL
  Filled 2023-10-24 (×19): qty 2

## 2023-10-24 MED ORDER — PANTOPRAZOLE SODIUM 40 MG IV SOLR
40.0000 mg | Freq: Every day | INTRAVENOUS | Status: AC
  Administered 2023-10-24 – 2023-10-25 (×2): 40 mg via INTRAVENOUS
  Filled 2023-10-24 (×2): qty 10

## 2023-10-24 MED ORDER — DEXMEDETOMIDINE HCL IN NACL 400 MCG/100ML IV SOLN
INTRAVENOUS | Status: DC | PRN
Start: 2023-10-24 — End: 2023-10-24
  Administered 2023-10-24: .3 ug/kg/h via INTRAVENOUS

## 2023-10-24 MED ORDER — OXYCODONE HCL 5 MG PO TABS: 5.0000 mg | ORAL_TABLET | ORAL | Status: DC | PRN

## 2023-10-24 MED ORDER — PROTAMINE SULFATE 10 MG/ML IV SOLN
INTRAVENOUS | Status: AC
  Filled 2023-10-24: qty 25

## 2023-10-24 MED ORDER — SODIUM CHLORIDE 0.9 % IV SOLN: INTRAVENOUS | Status: DC | PRN

## 2023-10-24 MED ORDER — MIDAZOLAM HCL (PF) 5 MG/ML IJ SOLN
INTRAMUSCULAR | Status: DC | PRN
  Administered 2023-10-24 (×3): 2 mg via INTRAVENOUS

## 2023-10-24 MED ORDER — SODIUM CHLORIDE 0.9% FLUSH
3.0000 mL | Freq: Two times a day (BID) | INTRAVENOUS | Status: DC
  Administered 2023-10-25 – 2023-10-27 (×3): 3 mL via INTRAVENOUS

## 2023-10-24 MED ORDER — SODIUM CHLORIDE 0.9% FLUSH
3.0000 mL | Freq: Two times a day (BID) | INTRAVENOUS | Status: DC
  Administered 2023-10-24: 3 mL via INTRAVENOUS
  Administered 2023-10-24: 5 mL via INTRAVENOUS
  Administered 2023-10-25: 3 mL via INTRAVENOUS

## 2023-10-24 MED ORDER — SUGAMMADEX SODIUM 200 MG/2ML IV SOLN
INTRAVENOUS | Status: DC | PRN
  Administered 2023-10-24: 200 mg via INTRAVENOUS

## 2023-10-24 MED ORDER — CHLORHEXIDINE GLUCONATE 0.12 % MT SOLN
15.0000 mL | OROMUCOSAL | Status: AC
  Administered 2023-10-24: 15 mL via OROMUCOSAL

## 2023-10-24 MED ORDER — CHLORHEXIDINE GLUCONATE CLOTH 2 % EX PADS
6.0000 | MEDICATED_PAD | Freq: Every day | CUTANEOUS | Status: DC
  Administered 2023-10-24 – 2023-10-27 (×4): 6 via TOPICAL

## 2023-10-24 MED ORDER — ASPIRIN 81 MG PO CHEW: 324.0000 mg | CHEWABLE_TABLET | Freq: Every day | ORAL | Status: DC

## 2023-10-24 MED ORDER — PROPOFOL 10 MG/ML IV BOLUS
INTRAVENOUS | Status: DC | PRN
Start: 2023-10-24 — End: 2023-10-24
  Administered 2023-10-24: 30 mg via INTRAVENOUS
  Administered 2023-10-24: 50 mg via INTRAVENOUS
  Administered 2023-10-24: 100 mg via INTRAVENOUS

## 2023-10-24 SURGICAL SUPPLY — 81 items
ADAPTER CARDIO PERF ANTE/RETRO (ADAPTER) ×3 IMPLANT
BAG DECANTER FOR FLEXI CONT (MISCELLANEOUS) ×3 IMPLANT
BLADE CLIPPER SURG (BLADE) ×3 IMPLANT
BLADE STERNUM SYSTEM 6 (BLADE) ×3 IMPLANT
BLADE SURG 15 STRL LF DISP TIS (BLADE) ×3 IMPLANT
CANISTER SUCT 3000ML PPV (MISCELLANEOUS) ×3 IMPLANT
CANNULA ARTERIAL VENT 3/8 20FR (CANNULA) IMPLANT
CANNULA GUNDRY RCSP 15FR (MISCELLANEOUS) ×3 IMPLANT
CANNULA MC2 2 STG 36/46 CONN (CANNULA) IMPLANT
CANNULA SUMP PERICARDIAL (CANNULA) IMPLANT
CATH HEART VENT LEFT (CATHETERS) ×3 IMPLANT
CATH ROBINSON RED A/P 18FR (CATHETERS) ×9 IMPLANT
CATH THORACIC 36FR (CATHETERS) ×3 IMPLANT
CATH THORACIC 36FR RT ANG (CATHETERS) ×3 IMPLANT
CAUTERY HI TEMP FINE TIP 2 (MISCELLANEOUS) IMPLANT
CNTNR URN SCR LID CUP LEK RST (MISCELLANEOUS) ×3 IMPLANT
CONTAINER PROTECT SURGISLUSH (MISCELLANEOUS) ×6 IMPLANT
COVER SURGICAL LIGHT HANDLE (MISCELLANEOUS) ×3 IMPLANT
DEVICE SUT CK QUICK LOAD MINI (Prosthesis & Implant Heart) IMPLANT
DRAPE SRG 135X102X78XABS (DRAPES) ×3 IMPLANT
DRAPE WARM FLUID 44X44 (DRAPES) ×3 IMPLANT
DRSG COVADERM 4X14 (GAUZE/BANDAGES/DRESSINGS) ×3 IMPLANT
ELECT CAUTERY BLADE 6.4 (BLADE) ×3 IMPLANT
ELECT REM PT RETURN 9FT ADLT (ELECTROSURGICAL) ×4 IMPLANT
ELECTRODE REM PT RTRN 9FT ADLT (ELECTROSURGICAL) ×6 IMPLANT
FELT TEFLON 1X6 (MISCELLANEOUS) ×6 IMPLANT
GAUZE 4X4 16PLY ~~LOC~~+RFID DBL (SPONGE) ×3 IMPLANT
GAUZE SPONGE 4X4 12PLY STRL (GAUZE/BANDAGES/DRESSINGS) ×3 IMPLANT
GAUZE SPONGE 4X4 12PLY STRL LF (GAUZE/BANDAGES/DRESSINGS) IMPLANT
GLOVE BIO SURGEON STRL SZ 6 (GLOVE) IMPLANT
GLOVE BIO SURGEON STRL SZ 6.5 (GLOVE) IMPLANT
GLOVE BIO SURGEON STRL SZ7 (GLOVE) IMPLANT
GLOVE BIO SURGEON STRL SZ7.5 (GLOVE) IMPLANT
GLOVE SURG MICRO LTX SZ7 (GLOVE) ×6 IMPLANT
GOWN STRL REUS W/ TWL LRG LVL3 (GOWN DISPOSABLE) ×12 IMPLANT
GOWN STRL REUS W/ TWL XL LVL3 (GOWN DISPOSABLE) ×3 IMPLANT
GRAFT HEMASHIELD 30X10 (Vascular Products) IMPLANT
HEMOSTAT POWDER SURGIFOAM 1G (HEMOSTASIS) ×9 IMPLANT
HEMOSTAT SURGICEL 2X14 (HEMOSTASIS) ×3 IMPLANT
KIT BASIN OR (CUSTOM PROCEDURE TRAY) ×3 IMPLANT
KIT CATH CPB BARTLE (MISCELLANEOUS) ×3 IMPLANT
KIT SUCTION CATH 14FR (SUCTIONS) ×3 IMPLANT
KIT SUT CK MINI COMBO 4X17 (Prosthesis & Implant Heart) IMPLANT
KIT TURNOVER KIT B (KITS) ×3 IMPLANT
LINE VENT (MISCELLANEOUS) IMPLANT
NS IRRIG 1000ML POUR BTL (IV SOLUTION) ×18 IMPLANT
PACK E OPEN HEART (SUTURE) ×3 IMPLANT
PACK OPEN HEART (CUSTOM PROCEDURE TRAY) ×3 IMPLANT
PAD ARMBOARD 7.5X6 YLW CONV (MISCELLANEOUS) ×6 IMPLANT
POSITIONER HEAD DONUT 9IN (MISCELLANEOUS) ×3 IMPLANT
SEALANT HEMOST PREVELEAK 4ML (HEMOSTASIS) IMPLANT
SET MPS 3-ND DEL (MISCELLANEOUS) IMPLANT
SET VEIN GRAFT PERF (SET/KITS/TRAYS/PACK) IMPLANT
SPONGE T-LAP 18X18 ~~LOC~~+RFID (SPONGE) ×12 IMPLANT
SPONGE T-LAP 4X18 ~~LOC~~+RFID (SPONGE) ×3 IMPLANT
SUT BONE WAX W31G (SUTURE) ×3 IMPLANT
SUT EB EXC GRN/WHT 2-0 V-5 (SUTURE) ×6 IMPLANT
SUT ETHIBON EXCEL 2-0 V-5 (SUTURE) IMPLANT
SUT ETHIBOND V-5 VALVE (SUTURE) IMPLANT
SUT PROLENE 3 0 SH 48 (SUTURE) IMPLANT
SUT PROLENE 3 0 SH DA (SUTURE) IMPLANT
SUT PROLENE 3 0 SH1 36 (SUTURE) ×3 IMPLANT
SUT PROLENE 4-0 RB1 .5 CRCL 36 (SUTURE) ×9 IMPLANT
SUT SILK 1 MH (SUTURE) IMPLANT
SUT SILK 2 0 SH CR/8 (SUTURE) IMPLANT
SUT STEEL 6MS V (SUTURE) IMPLANT
SUT VIC AB 1 CTX36XBRD ANBCTR (SUTURE) ×6 IMPLANT
SUT VIC AB 2-0 CTX 27 (SUTURE) IMPLANT
SUT VIC AB 3-0 X1 27 (SUTURE) IMPLANT
SYSTEM SAHARA CHEST DRAIN ATS (WOUND CARE) ×3 IMPLANT
TAPE CLOTH SURG 4X10 WHT LF (GAUZE/BANDAGES/DRESSINGS) IMPLANT
TAPE PAPER 2X10 WHT MICROPORE (GAUZE/BANDAGES/DRESSINGS) IMPLANT
TOWEL GREEN STERILE (TOWEL DISPOSABLE) ×3 IMPLANT
TOWEL GREEN STERILE FF (TOWEL DISPOSABLE) ×3 IMPLANT
TRAY FOLEY SLVR 16FR TEMP STAT (SET/KITS/TRAYS/PACK) ×3 IMPLANT
TUBE SUCT INTRACARD DLP 20F (MISCELLANEOUS) IMPLANT
UNDERPAD 30X36 HEAVY ABSORB (UNDERPADS AND DIAPERS) ×3 IMPLANT
VALVE AORTIC SZ23 INSP/RESIL (Prosthesis & Implant Heart) IMPLANT
VENT LEFT HEART 12002 (CATHETERS) ×2 IMPLANT
WATER STERILE IRR 1000ML POUR (IV SOLUTION) ×6 IMPLANT
YANKAUER SUCT BULB TIP NO VENT (SUCTIONS) IMPLANT

## 2023-10-24 NOTE — Discharge Summary (Signed)
 Physician Discharge Summary  Patient ID: Douglas Cole MRN: [id number] DOB/AGE: [birth year] 63 y.o.  Admit date: 10/24/2023 Discharge date: 10/29/2023  Admission Diagnoses:  Bicuspid aortic valve Severe aortic stenosis Thoracic aortic aneurysm Benign prostatic hyperplasia  Discharge Diagnoses:   Bicuspid aortic valve Severe aortic stenosis Thoracic aortic aneurysm Benign prostatic hyperplasia Expected acute blood loss anemia Post-operative thrombocytopenia  Discharged Condition: good  PCP is Douglas Bussing, MD Referring Provider is Douglas Lea, MD Primary cardiologist: Dr. Tomie Cole  History of Present Illness: The patient is a 63 year old gentleman with a history of prostate cancer and bicuspid aortic valve disease who had a CT of the chest on 06/16/2023 showing a 4.5 cm fusiform ascending aortic aneurysm.  He was referred to our office in December by his PCP and saw one of the PAs on 08/01/2023.  He heard a loud systolic murmur and our PA was concerned about the possibility of bicuspid aortic valve disease.  He was referred to cardiology and saw Dr. Tomie Cole on 08/18/2023.  He had a calcium scoring CT on 08/25/2023 showing a coronary calcium score of 0.  There was a 4.8 cm fusiform ascending aortic aneurysm and an aortic valve calcium score of 4387.  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  There was severe aortic stenosis with a mean gradient of 45.4 and a peak gradient of 74.4.  Valve area by VTI was 0.84 cm.  Left ventricular ejection fraction was 60 to 65% with LV dimensions.  He subsequently had a TEE on 09/05/2023 which showed a bicuspid aortic valve with fusion of the right and noncoronary cusps with a calcified raphae and bulky calcifications.  The mean gradient across the valve is measured at 28 mmHg with a dimensionless index of 0.2.  The ascending aorta was  measured at 4.6 cm.  The aortic root measured 3.5 cm.  He subsequently had cardiac catheterization done on 09/05/2023 showing no coronary disease.  Right heart pressures were normal.  Cardiac index was 2.9.   The patient is married and lives with his wife.  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  He denies any chest pressure or discomfort.  He has no exertional fatigue or shortness of breath.  Denies dizziness.  Has had no orthopnea or peripheral edema.  There is no family history of bicuspid aortic valve, aneurysm, or aortic dissection but he said that his brother does have a heart murmur.  This 63 year old gentleman has stage C, asymptomatic, severe bicuspid aortic valve stenosis with a 4.5 cm fusiform ascending aortic aneurysm extending from the sinotubular junction to the takeoff of the innominate artery. I think the best treatment is to proceed with open surgical aortic valve replacement using a bioprosthetic valve and replacement of the ascending aorta from the sinotubular junction to the takeoff of the innominate artery. This will require a period of hypothermic circulatory arrest. I think the risk of continuing to follow this is that he will develop left ventricular dysfunction and possibly further enlargement of the ascending aorta or acute aortic dissection. I reviewed the echo, cath, and CT images with the patient and his wife and answered their questions. I discussed the reasons that TAVR is not an option for treating this relatively young patient with a severely calcified bicuspid aortic valve and an ascending aortic aneurysm. I discussed the operative procedure using a bioprosthetic valve. I discussed  the alternatives, benefits and risks; including but not limited to bleeding, blood transfusion, infection, stroke, myocardial infarction, graft failure, heart block requiring a permanent pacemaker, organ dysfunction, and death. Douglas Cole understands and agrees to proceed.    Hospital Course:  Mr.  Douglas Cole was admitted to the hospital for elective surgery on 10/24/2023.  He was taken to the operating room where the aortic valve was replaced with a 23 mm Inspiris Resilia bioprosthetic valve.  The ascending aortic aneurysm was repaired utilizing a 30 mm Hemashield platinum graft using hypothermic circulatory arrest.  Following the procedure, he separated from cardiopulmonary bypass without difficulty.  He was transferred to the surgical ICU in stable condition. Drips were weaned as hemodynamics tolerated, arterial line and swan ganz catheter were removed without complication on POD1. He was routinely diuresed and his weight was back to baseline by post-op day 2. The mediastinal drain, drains, and cordis were removed on post-op day 2.  The foley catheter was also removed but he developed urinary retention. After having in and out urinary catheter placement for a couple of instances the decision was made to replace the Foley and leave in for at least 48 hours before attempting to remove again. His Flomax dose was increased.  He was transferred to 4E Progressive Care on post-op day 3. Mobilization was continued. He developed hematuria likely due to foley insertion. Voiding trial on 3/1 was successful.  Hematuria and passing of blood clots persist.  He will require outpatient follow up with his Urologist if symptoms do not improve.  His bowels began moving. He was tolerating a diet and ambulating well. His incisions were healing well without sign of infection.  He is medically stable for discharge home today   Consults: None  Significant Diagnostic Studies: radiology:   CT scan:      IMPRESSION: Calcification of the aortic valve with aneurysmal dilatation of the ascending aorta measuring 4.5 cm. Ascending thoracic aortic aneurysm. Recommend semi-annual imaging followup by CTA or MRA and referral to cardiothoracic surgery if not already obtained. This recommendation follows  2010 ACCF/AHA/AATS/ACR/ASA/SCA/SCAI/SIR/STS/SVM Guidelines for the Diagnosis and Management of Patients With Thoracic Aortic Disease. Circulation. 2010; 121: M841-L244. Aortic aneurysm NOS (ICD10-I71.9)    Electronically Signed   By: Thornell Sartorius M.D.   On: 06/24/2023 04:01  Cardiac graphics:   Echocardiogram:    IMPRESSIONS     1. Left ventricular ejection fraction, by estimation, is 60 to 65%. The  left ventricle has normal function.   2. Right ventricular systolic function is normal. The right ventricular  size is normal.   3. No left atrial/left atrial appendage thrombus was detected. The LAA  emptying velocity was 80 cm/s.   4. The mitral valve is grossly normal. Trivial mitral valve  regurgitation.   5. Bicuspid aortic valve. Probable fusion of right and non-coronary cusps  with calcified raphe and bulky calcifications extending toward the more  anterior commissure. . The aortic valve is bicuspid. There is severe  calcifcation of the aortic valve.  Aortic valve regurgitation is not visualized.   6. Aortic dilatation noted. Aneurysm of the ascending aorta, measuring 46  mm. There is moderate dilatation of the ascending aorta.   7. Probe was not easily passed into the stomach, transgastric views not  obtained. Hemodynamic assessment of aortic valve unable to be obtained by  TEE. Unable to redemonstrate severity of AS with TTE supplemental images.   Treatments:   CARDIOVASCULAR SURGERY OPERATIVE NOTE   10/24/2023  Surgeon:  Alleen Borne, MD   First Assistant: Jillyn Hidden,  PA-C: An experienced assistant was required given the complexity of this surgery and the standard of surgical care. The assistant was needed for exposure, dissection, suctioning, retraction of delicate tissues and sutures, instrument exchange and for overall help during this procedure.      Preoperative Diagnosis:  Severe bicuspid aortic valve stenosis and ascending aortic aneurysm      Postoperative Diagnosis:  Same     Procedure:   Median Sternotomy Extracorporeal circulation 3.   Replacement of the ascending aorta (hemi-arch) using a 30 mm Hemashield graft under deep hypothermic circulatory arrest. Supra-coronary proximal anastomosis. 4.   Aortic valve replacement using a 23 mm Edwards INSPIRIS RESILIA pericardial valve    Anesthesia:  General Endotracheal    Discharge Exam: Blood pressure 130/85, pulse 77, temperature 97.6 F (36.4 C), temperature source Oral, resp. rate 13, height 5\' 6"  (1.676 m), weight 73 kg, SpO2 100%.    General appearance: alert, cooperative, and no distress Heart: regular rate and rhythm Lungs: clear to auscultation bilaterally Abdomen: soft, non-tender; bowel sounds normal; no masses,  no organomegaly Extremities: extremities normal, atraumatic, no cyanosis or edema Wound: clean and dry   Discharge disposition: 01-Home or Self Care       Discharge Instructions     Amb Referral to Cardiac Rehabilitation   Complete by: As directed    Diagnosis: Valve Replacement   Valve: Aortic   After initial evaluation and assessments completed: Virtual Based Care may be provided alone or in conjunction with Phase 2 Cardiac Rehab based on patient barriers.: Yes   Intensive Cardiac Rehabilitation (ICR) [facility] location only OR Traditional Cardiac Rehabilitation (TCR) *If criteria for ICR are not met will enroll in TCR [facility] only): Yes      Allergies as of 10/29/2023       Reactions   Penicillins Rash   Reaction: 30 years        Medication List     TAKE these medications    aspirin EC 81 MG tablet Take 1 tablet (81 mg total) by mouth daily. Swallow whole.   metoprolol tartrate 25 MG tablet Commonly known as: LOPRESSOR Take 1 tablet (25 mg total) by mouth 2 (two) times daily.   tamsulosin 0.4 MG Caps capsule Commonly known as: FLOMAX Take 2 capsules (0.8 mg total) by mouth daily. What changed: how much to take   traMADol 50 MG  tablet Commonly known as: ULTRAM Take 1 tablet (50 mg total) by mouth every 6 (six) hours as needed for moderate pain (pain score 4-6).        Follow-up Information     Alleen Borne, MD. Go on 11/30/2023.   Specialty: Cardiothoracic Surgery Why: Your appointment is at 12:30pm. Please obtain a chest x-ray at [facility] located at [address] about 1 hour before the appointment. Contact information: [address] [place] [address] [phone]         [facility] Follow up on 11/07/2023.   Specialty: Cardiothoracic Surgery Why: Your appointment for suture removal is at 10am. Contact information: [address] [place], [address] [place] [address] [phone]        Flossie Dibble, NP. Go on 11/14/2023.   Specialty: Cardiology Why: Your appointment is at 10:30am. Contact information: [address] [place] [address] [phone]         [facility] [employer]. Go on 12/06/2023.   Specialty: Cardiology Why: Your appointment for follow up echocardiogram is at 9:30am. Contact information: [address] [place] [address] [phone]        [facility] Follow up on 11/30/2023.   Why: To get chest xray at 11:30AM Contact information: [address] [place] [address]        Crist Fat, MD. Schedule an appointment as soon as possible for a visit.   Specialty: Urology Why: Please contact and arrange follow up for 1-2 weeks hematuria doesn't resolve or you develop difficulty voiding Contact information: [address] [place] [address] [phone]                 The patient has been discharged on:   1.Beta Blocker:  Yes [  x]                              No   [   ]                              If No, reason:  2.Ace Inhibitor/ARB: Yes [   ]                                     No  [ x    ]                                     If No, reason:  labile BP  3.Statin:   Yes [  ]                  No  [ x  ]                  If No, reason: No CAD  4.Marlowe Kays:  Yes  [ x ]                  No   [   ]                  If No, reason:  5. ACS on Admission? No  P2Y12 Inhibitor:  Yes  [   ]                                No  [ x ]    Signed: Nijel Flink Raford Pitcher, PA-C 10/29/2023, 4:40 PM

## 2023-10-24 NOTE — Anesthesia Preprocedure Evaluation (Signed)
 Anesthesia Evaluation   Patient awake    Reviewed: Allergy & Precautions, NPO status , Patient's Chart, lab work & pertinent test results, reviewed documented beta blocker date and time   History of Anesthesia Complications Negative for: history of anesthetic complications  Airway Mallampati: II  TM Distance: >3 FB     Dental no notable dental hx.    Pulmonary neg COPD, neg PE   breath sounds clear to auscultation       Cardiovascular (-) angina (-) CAD and (-) Past MI + Valvular Problems/Murmurs AS  Rhythm:Regular Rate:Normal     Neuro/Psych  Headaches, neg Seizures    GI/Hepatic ,GERD  ,,(+) neg Cirrhosis        Endo/Other    Renal/GU Renal disease     Musculoskeletal   Abdominal   Peds  Hematology   Anesthesia Other Findings   Reproductive/Obstetrics                              Anesthesia Physical Anesthesia Plan  ASA: 4  Anesthesia Plan: General   Post-op Pain Management:    Induction: Intravenous  PONV Risk Score and Plan: 2 and Ondansetron and Propofol infusion  Airway Management Planned: Oral ETT  Additional Equipment: Arterial line, ClearSight, CVP, PA Cath, 3D TEE and Ultrasound Guidance Line Placement  Intra-op Plan:   Post-operative Plan: Post-operative intubation/ventilation  Informed Consent: I have reviewed the patients History and Physical, chart, labs and discussed the procedure including the risks, benefits and alternatives for the proposed anesthesia with the patient or authorized representative who has indicated his/her understanding and acceptance.     Dental advisory given  Plan Discussed with: CRNA  Anesthesia Plan Comments:          Anesthesia Quick Evaluation

## 2023-10-24 NOTE — Anesthesia Procedure Notes (Signed)
 Arterial Line Insertion Start/End2/24/2025 7:03 AM Performed by: Alease Medina, CRNA, CRNA  Patient location: Pre-op. Preanesthetic checklist: patient identified, IV checked, site marked, risks and benefits discussed, surgical consent, monitors and equipment checked, pre-op evaluation, timeout performed and anesthesia consent Lidocaine 1% used for infiltration Left, radial was placed Catheter size: 20 G Hand hygiene performed  and maximum sterile barriers used   Attempts: 1 Procedure performed without using ultrasound guided technique. Following insertion, dressing applied and Biopatch. Post procedure assessment: normal and unchanged  Patient tolerated the procedure well with no immediate complications.

## 2023-10-24 NOTE — Plan of Care (Signed)
  Problem: Pain Managment: Goal: General experience of comfort will improve and/or be controlled Outcome: Progressing   Problem: Safety: Goal: Ability to remain free from injury will improve Outcome: Progressing   Problem: Skin Integrity: Goal: Risk for impaired skin integrity will decrease Outcome: Progressing

## 2023-10-24 NOTE — Transfer of Care (Signed)
 Immediate Anesthesia Transfer of Care Note  Patient: Douglas Cole  Procedure(s) Performed: AORTIC VALVE REPLACEMENT (AVR) USING INSPIRIS RESILIA AORTIC VALVE SIZE (Chest) REPLACEMENT OF ASCENDING AORTA USING HEMASHIELD PLATIUM GRAFT SIZE (Chest) TRANSESOPHAGEAL ECHOCARDIOGRAM (TEE)  Patient Location: ICU  Anesthesia Type:General  Level of Consciousness: sedated and Patient remains intubated per anesthesia plan  Airway & Oxygen Therapy: Patient remains intubated per anesthesia plan and Patient placed on Ventilator (see vital sign flow sheet for setting)  Post-op Assessment: Report given to RN and Post -op Vital signs reviewed and stable  Post vital signs: Reviewed and stable  Last Vitals:  Vitals Value Taken Time  BP    Temp 36.3 C 10/24/23 1245  Pulse 80 10/24/23 1245  Resp 10 10/24/23 1244  SpO2 97 % 10/24/23 1245  Vitals shown include unfiled device data.  Last Pain:  Vitals:   10/24/23 0627  TempSrc:   PainSc: 0-No pain         Complications: No notable events documented.  Patient transported to ICU with standard monitors (HR, BP, SPO2, RR) and emergency drugs/equipment. Controlled ventilation maintained via ambu bag. Report given to bedside RN and respiratory therapist. Pt connected to ICU monitor and ventilator. All questions answered and vital signs stable before leaving

## 2023-10-24 NOTE — Op Note (Signed)
 CARDIOVASCULAR SURGERY OPERATIVE NOTE  10/24/2023  Surgeon:  Alleen Borne, MD  First Assistant: Jillyn Hidden,  PA-C: An experienced assistant was required given the complexity of this surgery and the standard of surgical care. The assistant was needed for exposure, dissection, suctioning, retraction of delicate tissues and sutures, instrument exchange and for overall help during this procedure.    Preoperative Diagnosis:  Severe bicuspid aortic valve stenosis and ascending aortic aneurysm   Postoperative Diagnosis:  Same   Procedure:  Median Sternotomy Extracorporeal circulation 3.   Replacement of the ascending aorta (hemi-arch) using a 30 mm Hemashield graft under deep hypothermic circulatory arrest. Supra-coronary proximal anastomosis. 4.   Aortic valve replacement using a 23 mm Edwards INSPIRIS RESILIA pericardial valve   Anesthesia:  General Endotracheal   Clinical History/Surgical Indication:   This 63 year old gentleman has stage C, asymptomatic, severe bicuspid aortic valve stenosis with a 4.5 cm fusiform ascending aortic aneurysm extending from the sinotubular junction to the takeoff of the innominate artery. I think the best treatment is to proceed with open surgical aortic valve replacement using a bioprosthetic valve and replacement of the ascending aorta from the sinotubular junction to the takeoff of the innominate artery. This will require a period of hypothermic circulatory arrest. I think the risk of continuing to follow this is that he will develop left ventricular dysfunction and possibly further enlargement of the ascending aorta or acute aortic dissection. I reviewed the echo, cath, and CT images with the patient and his wife and answered their questions. I discussed the reasons that TAVR is not an option for treating this relatively young patient with a severely calcified bicuspid aortic valve and an ascending aortic aneurysm. I discussed the operative  procedure using a bioprosthetic valve. I discussed the alternatives, benefits and risks; including but not limited to bleeding, blood transfusion, infection, stroke, myocardial infarction, graft failure, heart block requiring a permanent pacemaker, organ dysfunction, and death. Douglas Cole understands and agrees to proceed.    Preparation:  The patient was seen in the preoperative holding area and the correct patient, correct operation were confirmed with the patient after reviewing the medical record and catheterization. The consent was signed by me. Preoperative antibiotics were given. A pulmonary arterial line and radial arterial line were placed by the anesthesia team. The patient was taken back to the operating room and positioned supine on the operating room table. After being placed under general endotracheal anesthesia by the anesthesia team a foley catheter was placed. The neck, chest, abdomen, and both legs were prepped with betadine soap and solution and draped in the usual sterile manner. A surgical time-out was taken and the correct patient and operative procedure were confirmed with the nursing and anesthesia staff.  TEE:  Performed by Dr. Ace Gins. This showed a type 0 bicuspid aortic valve with severe calcification and severe stenosis. Normal LV systolic function.   Cardiopulmonary Bypass:  A median sternotomy was performed. The pericardium was opened in the midline. Right ventricular function appeared normal. The ascending aorta was aneurysmal and had no palpable plaque. There were no contraindications to aortic cannulation or cross-clamping. The patient was fully systemically heparinized and the ACT was maintained > 400 sec. The mid ascending aorta was cannulated with a 20 F aortic cannula for arterial inflow. Venous cannulation was performed via the right atrial appendage using a two-staged venous cannula. A temperature probe was inserted into the interventricular septum and an insulating  pad was placed in the pericardium. CO2 was  insufflated into the pericardium throughout the case to minimize intracardiac air.    Resection and grafting of ascending aortic aneurysm: Assisted by Jillyn Hidden, PA-C  The patient was placed on cardiopulmonary bypass and a left ventricular vent was placed via the right superior pulmonary vein. Systemic cooling was begun with a goal temperature of 18 degrees centigrade by bladder  temperature probes. A retrograde cardioplegia cannula was placed through the right atrium into the coronary sinus without difficulty. A retrograde cerebral perfusion cannula was placed into the SVC through a pursestring suture and the SVC was encircled with a silastic tape. After 20 minutes of cooling the target temperature of 18 degrees centigrade was reached. Cerebral oximetry was 70% bilaterally. BIS was zero. The patient was given Propofol and 125 mg of Solumedrol. The head was packed in ice. The bed was placed in steep trendelenburg. Circulatory arrest was begun and the blood volume emptied into the venous reservoir. Cold KBC retrograde cardioplegia was given and myocardial temperature dropped to 10 degrees centigrade. Additional doses were given at approximately 60 minute intervals throughout the period of circulatory arrest and cross-clamping. Complete diastolic arrest was maintained. Once the initial dose of cardioplegia was completed continuous retrograde cerebral perfusioon was begun and the SVC occluded with the silastic tape. The aortic cannula was removed. The aorta was transected just proximal to the innominate artery beveling the resection out along the undersurface of the aortic arch (Hemiarch replacement). The aortic diameter was measured at 30 mm here. A 30 x 10 mm Hemasheild Platinum vascular graft was prepared. ( REF # [id number] P0, Lot [id number], SN [id number]). It was anastomosed to the aortic arch in an end to end manner using 3-0 prolene continuous suture with a  felt strip to reinforce the anastomisis. A light coating of Prevaleak was applied to seal needle holes. The arterial end of the bypass circuit was then connected to the 10mm side arm graft and circulation was slowly resumed. The tape was removed from the SVC. The aortic graft was cross-clamped proximal to the side arm graft and full CPB support was resumed. Circulatory arrest time was 22 minutes. Retrograde cerebral perfusion time was 12 minutes.   Aortic Valve Replacement: Assisted by Jillyn Hidden, PA-C  A transverse aortotomy was performed 1 cm above the take-off of the right coronary artery. The native valve was a type 0 bicuspid with calcified leaflets and severe annular calcification. The ostia of the coronary arteries were in normal position and were not obstructed. The native valve leaflets were excised and the annulus was decalcified with rongeurs. Care was taken to remove all particulate debris. The left ventricle was directly inspected for debris and then irrigated with ice saline solution. The annulus was sized and a size 23 mm Edwards INSPIRIS RESILIA pericardial valve was chosen. The model number was [id number] and the serial number was [id number].  While the valve was being prepared 2-0 Ethibond pledgeted horizontal mattress sutures were placed around the annulus with the pledgets in a sub-annular position. The sutures were placed through the sewing ring and the valve lowered into place. The sutures were tied using CorKnots. The valve seated nicely and the coronary ostia were not obstructed. The prosthetic valve leaflets moved normally and there was no sub-valvular obstruction. The aortotomy was closed using 4-0 Prolene suture in 2 layers with felt strips to reinforce the closure.   The graft was then cut to the appropriate length and anastomosed end to end to the supra-coronary aorta using continuous 3-0 prolene suture  with a felt strip for reinforcement. Prevaleak was applied to seal the needle  holes in the graft. A vent cannula was placed into the graft to remove any air. Deairing maneuvers were performed and the bed placed in trendelenburg position.   Completion:   The patient was rewarmed to 37 degrees Centigrade. A dose of warm reanimation retrograde cardioplegia was given. The crossclamp was removed with a time of 83 minutes. There was spontaneous return of sinus rhythm. The position of the graft was satisfactory. The vascular anastomoses all appeared hemostatic. Two temporary epicardial pacing wires were placed on the right atrium and two on the right ventricle. The patient was weaned from CPB without difficulty on no inotropes CPB time was 151 minutes. Cardiac output was 5 LPM. TEE showed a normal functioning aortic valve prosthesis with no AI. Mean gradient was 6 mm Hg. LV function appeared normal. Heparin was fully reversed with protamine and the aortic and venous cannulas removed. Hemostasis was achieved. Mediastinal drainage tubes were placed. The sternum was closed with  #6 stainless steel wires. The fascia was closed with continuous # 1 vicryl suture. The subcutaneous tissue was closed with 2-0 vicryl continuous suture. The skin was closed with 3-0 vicryl subcuticular suture. All sponge, needle, and instrument counts were reported correct at the end of the case. Dry sterile dressings were placed over the incisions and around the chest tubes which were connected to pleurevac suction. The patient was then transported to the surgical intensive care unit in stable condition.

## 2023-10-24 NOTE — Anesthesia Postprocedure Evaluation (Signed)
 Anesthesia Post Note  Patient: PEPE MINEAU  Procedure(s) Performed: AORTIC VALVE REPLACEMENT (AVR) USING INSPIRIS RESILIA AORTIC VALVE SIZE (Chest) REPLACEMENT OF ASCENDING AORTA USING HEMASHIELD PLATIUM GRAFT SIZE (Chest) TRANSESOPHAGEAL ECHOCARDIOGRAM (TEE)     Patient location during evaluation: ICU Anesthesia Type: General Level of consciousness: patient remains intubated per anesthesia plan and sedated Pain management: pain level controlled Vital Signs Assessment: post-procedure vital signs reviewed and stable Respiratory status: patient on ventilator - see flowsheet for VS and patient remains intubated per anesthesia plan Cardiovascular status: blood pressure returned to baseline and stable Postop Assessment: no apparent nausea or vomiting Anesthetic complications: no   No notable events documented.  Last Vitals:  Vitals:   10/24/23 0715 10/24/23 1240  BP: (!) 121/90   Pulse:    Resp: 11   Temp:    SpO2:  98%    Last Pain:  Vitals:   10/24/23 0627  TempSrc:   PainSc: 0-No pain                 Mariann Barter

## 2023-10-24 NOTE — Hospital Course (Addendum)
 PCP is Darrow Bussing, MD Referring Provider is Georgeanna Lea, MD Primary cardiologist: Dr. Tomie China  History of Present Illness: The patient is a 63 year old gentleman with a history of prostate cancer and bicuspid aortic valve disease who had a CT of the chest on 06/16/2023 showing a 4.5 cm fusiform ascending aortic aneurysm.  He was referred to our office in December by his PCP and saw one of the PAs on 08/01/2023.  He heard a loud systolic murmur and our PA was concerned about the possibility of bicuspid aortic valve disease.  He was referred to cardiology and saw Dr. Tomie China on 08/18/2023.  He had a calcium scoring CT on 08/25/2023 showing a coronary calcium score of 0.  There was a 4.8 cm fusiform ascending aortic aneurysm and an aortic valve calcium score of 4387.  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  There was severe aortic stenosis with a mean gradient of 45.4 and a peak gradient of 74.4.  Valve area by VTI was 0.84 cm.  Left ventricular ejection fraction was 60 to 65% with LV dimensions.  He subsequently had a TEE on 09/05/2023 which showed a bicuspid aortic valve with fusion of the right and noncoronary cusps with a calcified raphae and bulky calcifications.  The mean gradient across the valve is measured at 28 mmHg with a dimensionless index of 0.2.  The ascending aorta was measured at 4.6 cm.  The aortic root measured 3.5 cm.  He subsequently had cardiac catheterization done on 09/05/2023 showing no coronary disease.  Right heart pressures were normal.  Cardiac index was 2.9.   The patient is married and lives with his wife.  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  He denies any chest pressure or discomfort.  He has no exertional fatigue or shortness of breath.  Denies dizziness.  Has had no orthopnea or peripheral edema.  There is no family history of bicuspid aortic valve,  aneurysm, or aortic dissection but he said that his brother does have a heart murmur.  This 36 year old gentleman has stage C, asymptomatic, severe bicuspid aortic valve stenosis with a 4.5 cm fusiform ascending aortic aneurysm extending from the sinotubular junction to the takeoff of the innominate artery. I think the best treatment is to proceed with open surgical aortic valve replacement using a bioprosthetic valve and replacement of the ascending aorta from the sinotubular junction to the takeoff of the innominate artery. This will require a period of hypothermic circulatory arrest. I think the risk of continuing to follow this is that he will develop left ventricular dysfunction and possibly further enlargement of the ascending aorta or acute aortic dissection. I reviewed the echo, cath, and CT images with the patient and his wife and answered their questions. I discussed the reasons that TAVR is not an option for treating this relatively young patient with a severely calcified bicuspid aortic valve and an ascending aortic aneurysm. I discussed the operative procedure using a bioprosthetic valve. I discussed the alternatives, benefits and risks; including but not limited to bleeding, blood transfusion, infection, stroke, myocardial infarction, graft failure, heart block requiring a permanent pacemaker, organ dysfunction, and death. [name] understands and agrees to proceed.    Hospital Course:  Mr. Moure was admitted to the hospital for elective surgery on 10/24/2023.  He was taken to the operating room where the aortic valve  was replaced with a 23 mm Inspiris Resilia bioprosthetic valve.  The ascending aortic aneurysm was repaired utilizing a 30 mm Hemashield platinum graft using hypothermic circulatory arrest.  Following the procedure, he separated from cardiopulmonary bypass without difficulty.  He was transferred to the surgical ICU in stable condition. Drips were weaned as hemodynamics tolerated,  arterial line and swan ganz catheter were removed without complication on POD1. He was routinely diuresed and his weight was back to baseline by post-op day 2. The mediastinal drain, drains, and cordis were removed on post-op day 2.  The foley catheter was also removed but he developed urinary retention. After having in and out urinary catheter placement for a couple of instances the decision was made to replace the Foley and leave in for at least 48 hours before attempting to remove again. His Flomax dose was increased.  He was transferred to 4E Progressive Care on post-op day 3. Mobilization was continued. He developed hematuria likely due to foley insertion. Voiding trial on 3/1 was successful.  Hematuria and passing of blood clots persist.  He will require outpatient follow up with his Urologist if symptoms do not improve.  His bowels began moving. He was tolerating a diet and ambulating well. His incisions were healing well without sign of infection.  He is medically stable for discharge home today

## 2023-10-24 NOTE — Procedures (Signed)
 Extubation Procedure Note  Patient Details:   Name: Douglas Cole DOB: [birth year] MRN: [id number]   Airway Documentation:    Vent end date: 10/24/23 Vent end time: 1815   Evaluation  O2 sats: stable throughout Complications: No apparent complications Patient did tolerate procedure well. Bilateral Breath Sounds: Clear, Diminished   Yes  Pt achieved a NIF of -30 and VC of 1.7L with great to effort on all attempts. Pt extubated to 2L [place] per protocol. Pt tolerating well at this time, Cuff leak positive , no stridor noted, RN at bedside, RT will monitor as needed.   Thornell Mule 10/24/2023, 6:33 PM

## 2023-10-24 NOTE — Discharge Instructions (Signed)
 Discharge Instructions:  1. You may shower, please wash incisions daily with soap and water and keep dry.  If you wish to cover wounds with dressing you may do so but please keep clean and change daily.  No tub baths or swimming until incisions have completely healed.  If your incisions become red or develop any drainage please call our office at [phone]  2. No Driving until cleared by Dr. Laneta Simmers office and you are no longer using narcotic pain medications  3. Monitor your weight daily.. Please use the same scale and weigh at same time... If you gain 5-10 lbs in 48 hours with associated lower extremity swelling, please contact our office at [phone]  4. Fever of 101.5 for at least 24 hours with no source, please contact our office at [phone]  5. Activity- up as tolerated, please walk at least 3 times per day.  Avoid strenuous activity, no lifting, pushing, or pulling with your arms over 8-10 lbs for a minimum of 6 weeks  6. If any questions or concerns arise, please do not hesitate to contact our office at [phone]

## 2023-10-24 NOTE — Interval H&P Note (Signed)
 History and Physical Interval Note:  10/24/2023 6:29 AM  Douglas Cole  has presented today for surgery, with the diagnosis of SEVERE AS TAA.  The various methods of treatment have been discussed with the patient and family. After consideration of risks, benefits and other options for treatment, the patient has consented to  Procedure(s) with comments: AORTIC VALVE REPLACEMENT (AVR) (N/A) REPLACEMENT ASCENDING AORTA (N/A) - CIRC ARREST TRANSESOPHAGEAL ECHOCARDIOGRAM (TEE) (N/A) as a surgical intervention.  The patient's history has been reviewed, patient examined, no change in status, stable for surgery.  I have reviewed the patient's chart and labs.  Questions were answered to the patient's satisfaction.     Alleen Borne

## 2023-10-24 NOTE — Brief Op Note (Signed)
 10/24/2023  11:12 AM  PATIENT:  Douglas Cole  63 y.o. male  PRE-OPERATIVE DIAGNOSIS:  SEVERE BICUSPID AORTIC VALVE STENOSIS WITH AN ASCENDING AORTIC ANEURYSM  POST-OPERATIVE DIAGNOSIS:  SEVERE BICUSPID AORTIC VALVE STENOSIS WITH AN ASCENDING AORTIC ANEURYSM  PROCEDURE:   AORTIC VALVE REPLACEMENT (AVR) USING INSPIRIS RESILIA AORTIC VALVE SIZE  REPLACEMENT OF ASCENDING AORTA USING HEMASHIELD PLATIUM GRAFT SIZE (N/A)  USING HYPOTHERMIC CIRCULATORY ARREST  TRANSESOPHAGEAL ECHOCARDIOGRAM   SURGEON:   Alleen Borne, MD   PHYSICIAN ASSISTANT: Rodric Punch  ASSISTANTS: Ria Bush, RN, Scrub Person                     Melvenia Needles, RN, Scrub Person   ANESTHESIA:   general  EBL:   BLOOD ADMINISTERED: NONE  DRAINS:  mediastinal drains    LOCAL MEDICATIONS USED:  NONE  SPECIMEN:  Aortic valve leaflets and portion of the aneurysmal ascending aorta  DISPOSITION OF SPECIMEN:  PATHOLOGY  COUNTS:  correct  DICTATION: .Dragon Dictation  PLAN OF CARE: Admit to inpatient   PATIENT DISPOSITION:  ICU - intubated and hemodynamically stable.   Delay start of Pharmacological VTE agent (>24hrs) due to surgical blood loss or risk of bleeding: yes

## 2023-10-24 NOTE — Progress Notes (Signed)
 Post op echo ordered

## 2023-10-24 NOTE — Progress Notes (Signed)
 EVENING ROUNDS NOTE :     [address]       [place] [address]             [phone]                 * Day of Surgery * Procedure(s) (LRB): AORTIC VALVE REPLACEMENT (AVR) USING INSPIRIS RESILIA AORTIC VALVE SIZE (N/A) REPLACEMENT OF ASCENDING AORTA USING HEMASHIELD PLATIUM GRAFT SIZE (N/A) TRANSESOPHAGEAL ECHOCARDIOGRAM (TEE) (N/A)   Total Length of Stay:  LOS: 0 days  Events:   Minimal CT output Good HD Weaning to ext    BP 107/79   Pulse 80   Temp (!) 97.3 F (36.3 C)   Resp 16   Ht 5\' 6"  (1.676 m)   Wt 74.8 kg   SpO2 99%   BMI 26.63 kg/m   PAP: (16-56)/(0-15) 23/9 CO:  [3.8 L/min-4 L/min] 4 L/min CI:  [2 L/min/m2-2.19 L/min/m2] 2.19 L/min/m2  Vent Mode: SIMV;PRVC;PSV FiO2 (%):  [50 %] 50 % Set Rate:  [16 bmp] 16 bmp Vt Set:  [510 mL] 510 mL PEEP:  [5 cmH20] 5 cmH20 Pressure Support:  [10 cmH20] 10 cmH20 Plateau Pressure:  [13 cmH20] 13 cmH20   sodium chloride 10 mL/hr at 10/24/23 1600   albumin human 999 mL/hr at 10/24/23 1500    ceFAZolin (ANCEF) IV 2 g (10/24/23 1610)   dexmedetomidine (PRECEDEX) IV infusion 0.4 mcg/kg/hr (10/24/23 1600)   insulin 0.3 Units/hr (10/24/23 1600)   magnesium sulfate 20 mL/hr at 10/24/23 1600   nitroGLYCERIN 0 mcg/min (10/24/23 1240)   phenylephrine (NEO-SYNEPHRINE) Adult infusion 40 mcg/min (10/24/23 1600)   vancomycin      No intake/output data recorded.      Latest Ref Rng & Units 10/24/2023   12:54 PM 10/24/2023   11:31 AM 10/24/2023   11:27 AM  CBC  WBC 4.0 - 10.5 K/uL 11.4     Hemoglobin 13.0 - 17.0 g/dL 9.2  9.9  8.8   Hematocrit 39.0 - 52.0 % 26.6  29.0  26.0   Platelets 150 - 400 K/uL 101          Latest Ref Rng & Units 10/24/2023   11:31 AM 10/24/2023   11:27 AM 10/24/2023   11:06 AM  BMP  Glucose 70 - 99 mg/dL 914   782   BUN 8 - 23 mg/dL 13   14   Creatinine 9.56 - 1.24 mg/dL 2.13   0.86   Sodium 578 - 145 mmol/L 137  137  135   Potassium 3.5 - 5.1 mmol/L 4.7  4.7   5.9   Chloride 98 - 111 mmol/L 104   102     ABG    Component Value Date/Time   PHART 7.400 10/24/2023 1127   PCO2ART 39.3 10/24/2023 1127   PO2ART 372 (H) 10/24/2023 1127   HCO3 24.3 10/24/2023 1127   TCO2 26 10/24/2023 1131   ACIDBASEDEF 3.0 (H) 10/24/2023 0950   O2SAT 100 10/24/2023 1127       Brynda Greathouse, MD 10/24/2023 4:32 PM

## 2023-10-24 NOTE — Anesthesia Procedure Notes (Signed)
 Central Venous Catheter Insertion Performed by: Mariann Barter, MD, anesthesiologist Start/End2/24/2025 6:45 AM, 10/24/2023 7:00 AM Patient location: Pre-op. Preanesthetic checklist: patient identified, IV checked, site marked, risks and benefits discussed, surgical consent, monitors and equipment checked, pre-op evaluation, timeout performed and anesthesia consent Lidocaine 1% used for infiltration and patient sedated Hand hygiene performed  and maximum sterile barriers used  Catheter size: 8.5 Fr Sheath introducer Procedure performed using ultrasound guided technique. Ultrasound Notes:anatomy identified, needle tip was noted to be adjacent to the nerve/plexus identified, no ultrasound evidence of intravascular and/or intraneural injection and image(s) printed for medical record Attempts: 1 Following insertion, line sutured and dressing applied. Post procedure assessment: blood return through all ports, free fluid flow and no air  Patient tolerated the procedure well with no immediate complications.

## 2023-10-24 NOTE — Anesthesia Procedure Notes (Signed)
 Procedure Name: Intubation Date/Time: 10/24/2023 7:41 AM  Performed by: Alease Medina, CRNAPre-anesthesia Checklist: Patient identified, Emergency Drugs available, Suction available and Patient being monitored Patient Re-evaluated:Patient Re-evaluated prior to induction Oxygen Delivery Method: Circle system utilized Preoxygenation: Pre-oxygenation with 100% oxygen Induction Type: IV induction Ventilation: Mask ventilation without difficulty and Oral airway inserted - appropriate to patient size Laryngoscope Size: Mac and 4 Grade View: Grade II Tube type: Oral Tube size: 8.0 mm Number of attempts: 1 Airway Equipment and Method: Stylet and Oral airway Placement Confirmation: ETT inserted through vocal cords under direct vision, positive ETCO2 and breath sounds checked- equal and bilateral Secured at: 23 cm Tube secured with: Tape Dental Injury: Teeth and Oropharynx as per pre-operative assessment

## 2023-10-25 ENCOUNTER — Inpatient Hospital Stay (HOSPITAL_COMMUNITY): Payer: 59

## 2023-10-25 ENCOUNTER — Encounter (HOSPITAL_COMMUNITY): Payer: Self-pay | Admitting: Surgery

## 2023-10-25 LAB — POCT I-STAT 7, (LYTES, BLD GAS, ICA,H+H)
Acid-Base Excess: 0 mmol/L (ref 0.0–2.0)
Acid-base deficit: 2 mmol/L (ref 0.0–2.0)
Acid-base deficit: 1 mmol/L (ref 0.0–2.0)
Bicarbonate: 23.5 mmol/L (ref 20.0–28.0)
Bicarbonate: 24.6 mmol/L (ref 20.0–28.0)
Bicarbonate: 25.2 mmol/L (ref 20.0–28.0)
Calcium, Ion: 1.14 mmol/L — ABNORMAL LOW (ref 1.15–1.40)
Calcium, Ion: 1.15 mmol/L (ref 1.15–1.40)
Calcium, Ion: 1.12 mmol/L — ABNORMAL LOW (ref 1.15–1.40)
HCT: 28 % — ABNORMAL LOW (ref 39.0–52.0)
HCT: 26 % — ABNORMAL LOW (ref 39.0–52.0)
HCT: 24 % — ABNORMAL LOW (ref 39.0–52.0)
Hemoglobin: 9.5 g/dL — ABNORMAL LOW (ref 13.0–17.0)
Hemoglobin: 8.8 g/dL — ABNORMAL LOW (ref 13.0–17.0)
Hemoglobin: 8.2 g/dL — ABNORMAL LOW (ref 13.0–17.0)
O2 Saturation: 98 %
O2 Saturation: 99 %
O2 Saturation: 98 %
Patient temperature: 36.2
Patient temperature: 36.6
Patient temperature: 37.3
Potassium: 3.7 mmol/L (ref 3.5–5.1)
Potassium: 4.6 mmol/L (ref 3.5–5.1)
Potassium: 4.3 mmol/L (ref 3.5–5.1)
Sodium: 141 mmol/L (ref 135–145)
Sodium: 141 mmol/L (ref 135–145)
Sodium: 140 mmol/L (ref 135–145)
TCO2: 25 mmol/L (ref 22–32)
TCO2: 26 mmol/L (ref 22–32)
TCO2: 26 mmol/L (ref 22–32)
pCO2 arterial: 40.2 mmHg (ref 32–48)
pCO2 arterial: 42.9 mmHg (ref 32–48)
pCO2 arterial: 40.9 mmHg (ref 32–48)
pH, Arterial: 7.371 (ref 7.35–7.45)
pH, Arterial: 7.365 (ref 7.35–7.45)
pH, Arterial: 7.399 (ref 7.35–7.45)
pO2, Arterial: 107 mmHg (ref 83–108)
pO2, Arterial: 151 mmHg — ABNORMAL HIGH (ref 83–108)
pO2, Arterial: 114 mmHg — ABNORMAL HIGH (ref 83–108)

## 2023-10-25 LAB — BPAM CRYOPRECIPITATE
Blood Product Expiration Date: 202502262359
Blood Product Expiration Date: 202503012359
ISSUE DATE / TIME: 202502241115
ISSUE DATE / TIME: 202502241137
Unit Type and Rh: 5100
Unit Type and Rh: 5100

## 2023-10-25 LAB — BPAM FFP
Blood Product Expiration Date: 202503012359
Blood Product Expiration Date: 202503012359
ISSUE DATE / TIME: 202502241433
ISSUE DATE / TIME: 202502241433
Unit Type and Rh: 8400
Unit Type and Rh: 8400

## 2023-10-25 LAB — PREPARE CRYOPRECIPITATE
Unit division: 0
Unit division: 0

## 2023-10-25 LAB — PREPARE FRESH FROZEN PLASMA
Unit division: 0
Unit division: 0

## 2023-10-25 LAB — CBC
HCT: 25.9 % — ABNORMAL LOW (ref 39.0–52.0)
HCT: 26.6 % — ABNORMAL LOW (ref 39.0–52.0)
Hemoglobin: 9.2 g/dL — ABNORMAL LOW (ref 13.0–17.0)
Hemoglobin: 9.1 g/dL — ABNORMAL LOW (ref 13.0–17.0)
MCH: 30.5 pg (ref 26.0–34.0)
MCH: 29.7 pg (ref 26.0–34.0)
MCHC: 35.5 g/dL (ref 30.0–36.0)
MCHC: 34.2 g/dL (ref 30.0–36.0)
MCV: 85.8 fL (ref 80.0–100.0)
MCV: 86.9 fL (ref 80.0–100.0)
Platelets: 95 10*3/uL — ABNORMAL LOW (ref 150–400)
Platelets: 97 10*3/uL — ABNORMAL LOW (ref 150–400)
RBC: 3.02 MIL/uL — ABNORMAL LOW (ref 4.22–5.81)
RBC: 3.06 MIL/uL — ABNORMAL LOW (ref 4.22–5.81)
RDW: 13.2 % (ref 11.5–15.5)
RDW: 13.4 % (ref 11.5–15.5)
WBC: 11.8 10*3/uL — ABNORMAL HIGH (ref 4.0–10.5)
WBC: 10.5 10*3/uL (ref 4.0–10.5)
nRBC: 0 % (ref 0.0–0.2)
nRBC: 0 % (ref 0.0–0.2)

## 2023-10-25 LAB — BASIC METABOLIC PANEL
Anion gap: 3 — ABNORMAL LOW (ref 5–15)
Anion gap: 6 (ref 5–15)
BUN: 14 mg/dL (ref 8–23)
BUN: 16 mg/dL (ref 8–23)
CO2: 24 mmol/L (ref 22–32)
CO2: 28 mmol/L (ref 22–32)
Calcium: 7.6 mg/dL — ABNORMAL LOW (ref 8.9–10.3)
Calcium: 8.1 mg/dL — ABNORMAL LOW (ref 8.9–10.3)
Chloride: 109 mmol/L (ref 98–111)
Chloride: 102 mmol/L (ref 98–111)
Creatinine, Ser: 0.79 mg/dL (ref 0.61–1.24)
Creatinine, Ser: 0.83 mg/dL (ref 0.61–1.24)
GFR, Estimated: >60 mL/min (ref >60)
GFR, Estimated: >60 mL/min (ref >60)
Glucose, Bld: 126 mg/dL — ABNORMAL HIGH (ref 70–99)
Glucose, Bld: 135 mg/dL — ABNORMAL HIGH (ref 70–99)
Potassium: 4 mmol/L (ref 3.5–5.1)
Potassium: 3.9 mmol/L (ref 3.5–5.1)
Sodium: 136 mmol/L (ref 135–145)
Sodium: 136 mmol/L (ref 135–145)

## 2023-10-25 LAB — GLUCOSE, CAPILLARY
Glucose-Capillary: 106 mg/dL — ABNORMAL HIGH (ref 70–99)
Glucose-Capillary: 113 mg/dL — ABNORMAL HIGH (ref 70–99)
Glucose-Capillary: 114 mg/dL — ABNORMAL HIGH (ref 70–99)
Glucose-Capillary: 114 mg/dL — ABNORMAL HIGH (ref 70–99)
Glucose-Capillary: 115 mg/dL — ABNORMAL HIGH (ref 70–99)
Glucose-Capillary: 116 mg/dL — ABNORMAL HIGH (ref 70–99)
Glucose-Capillary: 118 mg/dL — ABNORMAL HIGH (ref 70–99)
Glucose-Capillary: 120 mg/dL — ABNORMAL HIGH (ref 70–99)
Glucose-Capillary: 137 mg/dL — ABNORMAL HIGH (ref 70–99)
Glucose-Capillary: 138 mg/dL — ABNORMAL HIGH (ref 70–99)
Glucose-Capillary: 155 mg/dL — ABNORMAL HIGH (ref 70–99)
Glucose-Capillary: 168 mg/dL — ABNORMAL HIGH (ref 70–99)
Glucose-Capillary: 130 mg/dL — ABNORMAL HIGH (ref 70–99)
Glucose-Capillary: 136 mg/dL — ABNORMAL HIGH (ref 70–99)
Glucose-Capillary: 121 mg/dL — ABNORMAL HIGH (ref 70–99)
Glucose-Capillary: 127 mg/dL — ABNORMAL HIGH (ref 70–99)

## 2023-10-25 LAB — PROTIME-INR
INR: 1.3 — ABNORMAL HIGH (ref 0.8–1.2)
Prothrombin Time: 16.4 s — ABNORMAL HIGH (ref 11.4–15.2)

## 2023-10-25 LAB — MAGNESIUM
Magnesium: 2.4 mg/dL (ref 1.7–2.4)
Magnesium: 2.3 mg/dL (ref 1.7–2.4)

## 2023-10-25 LAB — TYPE AND SCREEN
ABO/RH(D): AB POS
Antibody Screen: NEGATIVE

## 2023-10-25 LAB — SURGICAL PATHOLOGY

## 2023-10-25 MED ORDER — INSULIN ASPART 100 UNIT/ML IJ SOLN
0.0000 [IU] | INTRAMUSCULAR | Status: DC
  Administered 2023-10-25 (×4): 2 [IU] via SUBCUTANEOUS

## 2023-10-25 MED ORDER — KETOROLAC TROMETHAMINE 15 MG/ML IJ SOLN
15.0000 mg | Freq: Four times a day (QID) | INTRAMUSCULAR | Status: DC | PRN
Start: 2023-10-25 — End: 2023-10-30
  Administered 2023-10-25 – 2023-10-26 (×4): 15 mg via INTRAVENOUS
  Filled 2023-10-25 (×4): qty 1

## 2023-10-25 MED ORDER — POTASSIUM CHLORIDE CRYS ER 20 MEQ PO TBCR
20.0000 meq | EXTENDED_RELEASE_TABLET | Freq: Two times a day (BID) | ORAL | Status: AC
  Administered 2023-10-25 (×2): 20 meq via ORAL
  Filled 2023-10-25 (×2): qty 1

## 2023-10-25 MED ORDER — ALPRAZOLAM 0.25 MG PO TABS
0.2500 mg | ORAL_TABLET | Freq: Two times a day (BID) | ORAL | Status: DC | PRN
  Administered 2023-10-25 – 2023-10-28 (×4): 0.25 mg via ORAL
  Filled 2023-10-25 (×4): qty 1

## 2023-10-25 MED ORDER — ASPIRIN 81 MG PO TBEC
81.0000 mg | DELAYED_RELEASE_TABLET | Freq: Every day | ORAL | Status: DC
  Administered 2023-10-25 – 2023-10-29 (×5): 81 mg via ORAL
  Filled 2023-10-25 (×5): qty 1

## 2023-10-25 MED ORDER — FUROSEMIDE 10 MG/ML IJ SOLN
40.0000 mg | Freq: Two times a day (BID) | INTRAMUSCULAR | Status: AC
  Administered 2023-10-25 (×2): 40 mg via INTRAVENOUS
  Filled 2023-10-25 (×2): qty 4

## 2023-10-25 MED FILL — Sodium Bicarbonate IV Soln 8.4%: INTRAVENOUS | Qty: 50 | Status: AC

## 2023-10-25 MED FILL — Electrolyte-R (PH 7.4) Solution: INTRAVENOUS | Qty: 4000 | Status: AC

## 2023-10-25 MED FILL — Calcium Chloride Inj 10%: INTRAVENOUS | Qty: 10 | Status: AC

## 2023-10-25 MED FILL — Heparin Sodium (Porcine) Inj 1000 Unit/ML: INTRAMUSCULAR | Qty: 10 | Status: AC

## 2023-10-25 MED FILL — Sodium Chloride IV Soln 0.9%: INTRAVENOUS | Qty: 2000 | Status: AC

## 2023-10-25 MED FILL — Lidocaine HCl Local Preservative Free (PF) Inj 2%: INTRAMUSCULAR | Qty: 14 | Status: AC

## 2023-10-25 MED FILL — Mannitol IV Soln 20%: INTRAVENOUS | Qty: 500 | Status: AC

## 2023-10-25 MED FILL — Albumin, Human Inj 5%: INTRAVENOUS | Qty: 250 | Status: AC

## 2023-10-25 NOTE — Progress Notes (Signed)
 1 Day Post-Op Procedure(s) (LRB): AORTIC VALVE REPLACEMENT (AVR) USING INSPIRIS RESILIA AORTIC VALVE SIZE (N/A) REPLACEMENT OF ASCENDING AORTA USING HEMASHIELD PLATIUM GRAFT SIZE (N/A) TRANSESOPHAGEAL ECHOCARDIOGRAM (TEE) (N/A) Subjective: No complaints. Some anxiety which he had preop.  Objective: Vital signs in last 24 hours: Temp:  [96.3 F (35.7 C)-99.1 F (37.3 C)] 99.1 F (37.3 C) (02/25 0500) Pulse Rate:  [56-89] 87 (02/25 0500) Cardiac Rhythm: Atrial paced (02/24 1934) Resp:  [11-19] 16 (02/24 1645) BP: (94-133)/(64-90) 119/70 (02/25 0500) SpO2:  [95 %-100 %] 98 % (02/25 0500) Arterial Line BP: (81-164)/(46-77) 164/68 (02/25 0500) FiO2 (%):  [40 %-50 %] 40 % (02/24 1750) Weight:  [79.6 kg] 79.6 kg (02/25 0500)  Hemodynamic parameters for last 24 hours: PAP: (16-56)/(0-15) 24/10 CO:  [3.8 L/min-5.3 L/min] 5.3 L/min CI:  [2 L/min/m2-2.87 L/min/m2] 2.87 L/min/m2  Intake/Output from previous day: 02/24 0701 - 02/25 0700 In: 5032.3 [I.V.:1923; Blood:1255.7; NG/GT:60; IV Piggyback:1793.7] Out: 4610 [Urine:3090; Blood:900; Chest Tube:620] Intake/Output this shift: Total I/O In: 564.4 [I.V.:364.4; IV Piggyback:200] Out: 960 [Urine:740; Chest Tube:220]  General appearance: alert and cooperative Neurologic: intact Heart: regular rate and rhythm and rub present Lungs: clear to auscultation bilaterally Extremities: edema mild Wound: dressings dry  Lab Results: Recent Labs    10/24/23 1900 10/25/23 0512  WBC 7.8 11.8*  HGB 9.4* 9.2*  HCT 26.9* 25.9*  PLT 84* 95*   BMET:  Recent Labs    10/24/23 1900 10/25/23 0512  NA 139 136  K 4.3 4.0  CL 107 109  CO2 24 24  GLUCOSE 135* 126*  BUN 15 14  CREATININE 0.80 0.79  CALCIUM 8.0* 7.6*    PT/INR:  Recent Labs    10/24/23 1254  LABPROT 28.2*  INR 2.6*   ABG    Component Value Date/Time   PHART 7.400 10/24/2023 1127   HCO3 24.3 10/24/2023 1127   TCO2 26 10/24/2023 1131   ACIDBASEDEF 3.0 (H)  10/24/2023 0950   O2SAT 100 10/24/2023 1127   CBG (last 3)  Recent Labs    10/25/23 0106 10/25/23 0301 10/25/23 0433  GLUCAP 114* 113* 106*   CXR: clear  ECG: pending  Assessment/Plan: S/P Procedure(s) (LRB): AORTIC VALVE REPLACEMENT (AVR) USING INSPIRIS RESILIA AORTIC VALVE SIZE (N/A) REPLACEMENT OF ASCENDING AORTA USING HEMASHIELD PLATIUM GRAFT SIZE (N/A) TRANSESOPHAGEAL ECHOCARDIOGRAM (TEE) (N/A)  POD 1 Hemodynamically stable in sinus rhythm. Continue Lopressor.  Wt is 10 lbs over preop. Start diuresis.  CT output low but still bloody. Will keep tubes in. INR was 2.6 postop and got 2 units FFP. Will repeat this am.  Glucose under good control on SSI.  DC swan, arterial line.  OOB, IS.  Add Toradol for pain and decrease ASA to 81 mg.   LOS: 1 day    Alleen Borne 10/25/2023

## 2023-10-25 NOTE — Plan of Care (Signed)
  Problem: Cardiac: Goal: Will achieve and/or maintain hemodynamic stability Outcome: Progressing   Problem: Clinical Measurements: Goal: Postoperative complications will be avoided or minimized Outcome: Progressing   Problem: Respiratory: Goal: Respiratory status will improve Outcome: Progressing   Problem: Skin Integrity: Goal: Wound healing without signs and symptoms of infection Outcome: Progressing

## 2023-10-25 NOTE — Progress Notes (Signed)
      [address]       [place] [address]             [phone]    POD # 1 AVR, ascending aneurysm  BP 103/63   Pulse 73   Temp 98.1 F (36.7 C) (Oral)   Resp 16   Ht 5\' 6"  (1.676 m)   Wt 79.6 kg   SpO2 92%   BMI 28.32 kg/m     Intake/Output Summary (Last 24 hours) at 10/25/2023 1956 Last data filed at 10/25/2023 1852 Gross per 24 hour  Intake 1554.36 ml  Output 3825 ml  Net -2270.64 ml   K 3.9, creatinine 0.83 Hct 27 PLT 97 CBG mildly elevated  Doing well POD # 1  Monty Spicher C. Dorris Fetch, MD [facility] [phone]

## 2023-10-26 ENCOUNTER — Inpatient Hospital Stay (HOSPITAL_COMMUNITY): Payer: 59

## 2023-10-26 LAB — BASIC METABOLIC PANEL
Anion gap: 3 — ABNORMAL LOW (ref 5–15)
BUN: 17 mg/dL (ref 8–23)
CO2: 29 mmol/L (ref 22–32)
Calcium: 8.2 mg/dL — ABNORMAL LOW (ref 8.9–10.3)
Chloride: 103 mmol/L (ref 98–111)
Creatinine, Ser: 0.78 mg/dL (ref 0.61–1.24)
GFR, Estimated: >60 mL/min (ref >60)
Glucose, Bld: 115 mg/dL — ABNORMAL HIGH (ref 70–99)
Potassium: 3.9 mmol/L (ref 3.5–5.1)
Sodium: 135 mmol/L (ref 135–145)

## 2023-10-26 LAB — CBC
HCT: 24.9 % — ABNORMAL LOW (ref 39.0–52.0)
Hemoglobin: 8.5 g/dL — ABNORMAL LOW (ref 13.0–17.0)
MCH: 30 pg (ref 26.0–34.0)
MCHC: 34.1 g/dL (ref 30.0–36.0)
MCV: 88 fL (ref 80.0–100.0)
Platelets: 86 10*3/uL — ABNORMAL LOW (ref 150–400)
RBC: 2.83 MIL/uL — ABNORMAL LOW (ref 4.22–5.81)
RDW: 13.2 % (ref 11.5–15.5)
WBC: 8.3 10*3/uL (ref 4.0–10.5)
nRBC: 0 % (ref 0.0–0.2)

## 2023-10-26 LAB — GLUCOSE, CAPILLARY
Glucose-Capillary: 109 mg/dL — ABNORMAL HIGH (ref 70–99)
Glucose-Capillary: 111 mg/dL — ABNORMAL HIGH (ref 70–99)
Glucose-Capillary: 116 mg/dL — ABNORMAL HIGH (ref 70–99)
Glucose-Capillary: 117 mg/dL — ABNORMAL HIGH (ref 70–99)
Glucose-Capillary: 117 mg/dL — ABNORMAL HIGH (ref 70–99)
Glucose-Capillary: 121 mg/dL — ABNORMAL HIGH (ref 70–99)
Glucose-Capillary: 141 mg/dL — ABNORMAL HIGH (ref 70–99)

## 2023-10-26 MED ORDER — METOPROLOL TARTRATE 25 MG/10 ML ORAL SUSPENSION: 25.0000 mg | Freq: Two times a day (BID) | ORAL | Status: DC

## 2023-10-26 MED ORDER — METOPROLOL TARTRATE 25 MG PO TABS
25.0000 mg | ORAL_TABLET | Freq: Two times a day (BID) | ORAL | Status: DC
  Administered 2023-10-26 – 2023-10-27 (×3): 25 mg via ORAL
  Filled 2023-10-26 (×3): qty 1

## 2023-10-26 MED ORDER — TAMSULOSIN HCL 0.4 MG PO CAPS
0.4000 mg | ORAL_CAPSULE | Freq: Every day | ORAL | Status: DC
  Administered 2023-10-26: 0.4 mg via ORAL
  Filled 2023-10-26: qty 1

## 2023-10-26 MED ORDER — BOOST / RESOURCE BREEZE PO LIQD CUSTOM
1.0000 | Freq: Three times a day (TID) | ORAL | Status: DC
  Administered 2023-10-27 – 2023-10-28 (×4): 1 via ORAL

## 2023-10-26 MED FILL — Lidocaine HCl Local Preservative Free (PF) Inj 2%: INTRAMUSCULAR | Qty: 14 | Status: AC

## 2023-10-26 MED FILL — Potassium Chloride Inj 2 mEq/ML: INTRAVENOUS | Qty: 40 | Status: AC

## 2023-10-26 MED FILL — Heparin Sodium (Porcine) Inj 1000 Unit/ML: Qty: 1000 | Status: AC

## 2023-10-26 NOTE — Progress Notes (Signed)
 Patient ID: Douglas Cole, male   DOB: 1960/12/14, 63 y.o.   MRN: [id number]  TCTS Evening Rounds:  Hemodynamically stable in sinus rhythm.   Pacing wires and tubes out.  Ambulated twice.   Plan transfer to 4E in am.

## 2023-10-26 NOTE — Progress Notes (Signed)
 2 Days Post-Op Procedure(s) (LRB): AORTIC VALVE REPLACEMENT (AVR) USING INSPIRIS RESILIA AORTIC VALVE SIZE (N/A) REPLACEMENT OF ASCENDING AORTA USING HEMASHIELD PLATIUM GRAFT SIZE (N/A) TRANSESOPHAGEAL ECHOCARDIOGRAM (TEE) (N/A) Subjective: No complaints. Slept well. Ambulated around the ICU this am.  Objective: Vital signs in last 24 hours: Temp:  [97.7 F (36.5 C)-98.8 F (37.1 C)] 98.1 F (36.7 C) (02/25 1643) Pulse Rate:  [61-88] 88 (02/26 0600) Cardiac Rhythm: Normal sinus rhythm (02/26 0400) BP: (81-121)/(46-79) 118/73 (02/26 0600) SpO2:  [88 %-100 %] 97 % (02/26 0600) Arterial Line BP: (175)/(70) 175/70 (02/25 0800) Weight:  [75.1 kg] 75.1 kg (02/26 0500)  Hemodynamic parameters for last 24 hours: PAP: (26)/(10) 26/10 CO:  [5.3 L/min] 5.3 L/min CI:  [2.9 L/min/m2] 2.9 L/min/m2  Intake/Output from previous day: 02/25 0701 - 02/26 0700 In: 1190 [P.O.:660; I.V.:130; IV Piggyback:400] Out: 9147 [Urine:3295; Chest Tube:330] Intake/Output this shift: No intake/output data recorded.  General appearance: alert and cooperative Neurologic: intact Heart: regular rate and rhythm Lungs: clear to auscultation bilaterally Extremities: no edema Wound: incision looks good.  Lab Results: Recent Labs    10/25/23 1610 10/26/23 0518  WBC 10.5 8.3  HGB 9.1* 8.5*  HCT 26.6* 24.9*  PLT 97* 86*   BMET:  Recent Labs    10/25/23 1610 10/26/23 0518  NA 136 135  K 3.9 3.9  CL 102 103  CO2 28 29  GLUCOSE 135* 115*  BUN 16 17  CREATININE 0.83 0.78  CALCIUM 8.1* 8.2*    PT/INR:  Recent Labs    10/25/23 1153  LABPROT 16.4*  INR 1.3*   ABG    Component Value Date/Time   PHART 7.399 10/24/2023 2015   HCO3 25.2 10/24/2023 2015   TCO2 26 10/24/2023 2015   ACIDBASEDEF 1.0 10/24/2023 1817   O2SAT 98 10/24/2023 2015   CBG (last 3)  Recent Labs    10/25/23 2055 10/26/23 0046 10/26/23 0436  GLUCAP 127* 117* 116*   CXR: clear Assessment/Plan: S/P  Procedure(s) (LRB): AORTIC VALVE REPLACEMENT (AVR) USING INSPIRIS RESILIA AORTIC VALVE SIZE (N/A) REPLACEMENT OF ASCENDING AORTA USING HEMASHIELD PLATIUM GRAFT SIZE (N/A) TRANSESOPHAGEAL ECHOCARDIOGRAM (TEE) (N/A)  POD 2  Hemodynamically stable in sinus rhythm with some PAC's. Will increase Lopressor to 25 bid.  -2435 cc yesterday and wt back to baseline.  Chest tube output has continued to decrease and thin. Will remove pacing wires this am and then if stable 2 hrs later remove chest tubes, sleeve and foley.  Glucose under good control. DC CBG's and SSI.  Continue IS, mobilization.  Transfer to 4E after chest tube removal.     LOS: 2 days    Alleen Borne 10/26/2023

## 2023-10-27 ENCOUNTER — Encounter (HOSPITAL_COMMUNITY): Payer: Self-pay | Admitting: Surgery

## 2023-10-27 ENCOUNTER — Other Ambulatory Visit: Payer: Self-pay

## 2023-10-27 LAB — GLUCOSE, CAPILLARY
Glucose-Capillary: 100 mg/dL — ABNORMAL HIGH (ref 70–99)
Glucose-Capillary: 124 mg/dL — ABNORMAL HIGH (ref 70–99)

## 2023-10-27 MED ORDER — DOCUSATE SODIUM 100 MG PO CAPS
200.0000 mg | ORAL_CAPSULE | Freq: Every day | ORAL | Status: DC
  Administered 2023-10-28 – 2023-10-29 (×2): 200 mg via ORAL
  Filled 2023-10-27 (×3): qty 2

## 2023-10-27 MED ORDER — METOPROLOL TARTRATE 25 MG PO TABS
25.0000 mg | ORAL_TABLET | Freq: Two times a day (BID) | ORAL | Status: DC
  Administered 2023-10-27 – 2023-10-29 (×4): 25 mg via ORAL
  Filled 2023-10-27 (×5): qty 1

## 2023-10-27 MED ORDER — ONDANSETRON HCL 4 MG/2ML IJ SOLN: 4.0000 mg | Freq: Four times a day (QID) | INTRAMUSCULAR | Status: DC | PRN

## 2023-10-27 MED ORDER — TAMSULOSIN HCL 0.4 MG PO CAPS
0.8000 mg | ORAL_CAPSULE | Freq: Every day | ORAL | Status: DC
Start: 2023-10-27 — End: 2023-10-29
  Administered 2023-10-27 – 2023-10-29 (×3): 0.8 mg via ORAL
  Filled 2023-10-27 (×3): qty 2

## 2023-10-27 MED ORDER — SODIUM CHLORIDE 0.9% FLUSH
3.0000 mL | Freq: Two times a day (BID) | INTRAVENOUS | Status: DC
  Administered 2023-10-27 – 2023-10-29 (×5): 3 mL via INTRAVENOUS

## 2023-10-27 MED ORDER — LACTULOSE 10 GM/15ML PO SOLN
20.0000 g | Freq: Every day | ORAL | Status: DC | PRN
  Administered 2023-10-28: 20 g via ORAL
  Filled 2023-10-27: qty 30

## 2023-10-27 MED ORDER — TAMSULOSIN HCL 0.4 MG PO CAPS: 0.4000 mg | ORAL_CAPSULE | Freq: Two times a day (BID) | ORAL | Status: DC

## 2023-10-27 MED ORDER — CHLORHEXIDINE GLUCONATE CLOTH 2 % EX PADS
6.0000 | MEDICATED_PAD | Freq: Every day | CUTANEOUS | Status: DC
Start: 2023-10-27 — End: 2023-10-29
  Administered 2023-10-28: 6 via TOPICAL

## 2023-10-27 MED ORDER — ~~LOC~~ CARDIAC SURGERY, PATIENT & FAMILY EDUCATION: Freq: Once | Status: AC

## 2023-10-27 MED ORDER — SODIUM CHLORIDE 0.9 % IV SOLN: 250.0000 mL | INTRAVENOUS | Status: AC | PRN

## 2023-10-27 MED ORDER — ONDANSETRON HCL 4 MG PO TABS: 4.0000 mg | ORAL_TABLET | Freq: Four times a day (QID) | ORAL | Status: DC | PRN

## 2023-10-27 MED ORDER — SODIUM CHLORIDE 0.9% FLUSH: 3.0000 mL | INTRAVENOUS | Status: DC | PRN

## 2023-10-27 NOTE — Progress Notes (Signed)
 3 Days Post-Op Procedure(s) (LRB): AORTIC VALVE REPLACEMENT (AVR) USING INSPIRIS RESILIA AORTIC VALVE SIZE (N/A) REPLACEMENT OF ASCENDING AORTA USING HEMASHIELD PLATIUM GRAFT SIZE (N/A) TRANSESOPHAGEAL ECHOCARDIOGRAM (TEE) (N/A) Subjective:  Feels well except for urinary retention overnight. He was in/out cathed a few time for high residual bladder volumes. Could only  pass a few drops on his own. He has had BPH on Flomax and had this problem previously.  Objective: Vital signs in last 24 hours: Temp:  [97.7 F (36.5 C)-98.6 F (37 C)] 97.7 F (36.5 C) (02/27 0400) Pulse Rate:  [66-101] 74 (02/27 0500) Cardiac Rhythm: Normal sinus rhythm (02/27 0400) BP: (100-151)/(56-105) 138/83 (02/27 0500) SpO2:  [80 %-99 %] 95 % (02/27 0500) Weight:  [72.9 kg] 72.9 kg (02/27 0500)  Hemodynamic parameters for last 24 hours:    Intake/Output from previous day: 02/26 0701 - 02/27 0700 In: 400 [P.O.:400] Out: 2015 [Urine:1975; Chest Tube:40] Intake/Output this shift: Total I/O In: -  Out: 1525 [Urine:1525]  General appearance: alert and cooperative Neurologic: intact Heart: regular rate and rhythm Lungs: clear to auscultation bilaterally Abdomen: soft, non-tender; bowel sounds normal Extremities: no edema Wound: incision healing well  Lab Results: Recent Labs    10/25/23 1610 10/26/23 0518  WBC 10.5 8.3  HGB 9.1* 8.5*  HCT 26.6* 24.9*  PLT 97* 86*   BMET:  Recent Labs    10/25/23 1610 10/26/23 0518  NA 136 135  K 3.9 3.9  CL 102 103  CO2 28 29  GLUCOSE 135* 115*  BUN 16 17  CREATININE 0.83 0.78  CALCIUM 8.1* 8.2*    PT/INR:  Recent Labs    10/25/23 1153  LABPROT 16.4*  INR 1.3*   ABG    Component Value Date/Time   PHART 7.399 10/24/2023 2015   HCO3 25.2 10/24/2023 2015   TCO2 26 10/24/2023 2015   ACIDBASEDEF 1.0 10/24/2023 1817   O2SAT 98 10/24/2023 2015   CBG (last 3)  Recent Labs    10/26/23 2032 10/26/23 2357 10/27/23 0454  GLUCAP 121*  109* 100*    Assessment/Plan: S/P Procedure(s) (LRB): AORTIC VALVE REPLACEMENT (AVR) USING INSPIRIS RESILIA AORTIC VALVE SIZE (N/A) REPLACEMENT OF ASCENDING AORTA USING HEMASHIELD PLATIUM GRAFT SIZE (N/A) TRANSESOPHAGEAL ECHOCARDIOGRAM (TEE) (N/A)  POD 3  Hemodynamically stable in sinus rhythm. Continue lopressor.   Postop urinary retention due to BPH. Insert foley and leave in for a few more days. Increase Flomax to 0.8 daily. Will remove foley on Saturday and if he can't void will insert and send home with foley to followup with his urologist.  N[place] Fordyce is below preop.  Transfer to 4E and continue IS, ambulation.   LOS: 3 days    Alleen Borne 10/27/2023

## 2023-10-27 NOTE — TOC Initial Note (Signed)
 Transition of Care [facility]) - Initial/Assessment Note    Patient Details  Name: Douglas Cole MRN: [id number] Date of Birth: 03/10/1961  Transition of Care [facility]) CM/SW Contact:    Gala Lewandowsky, RN Phone Number: 10/27/2023, 11:42 AM  Clinical Narrative:  Patient  POD-3 AVR. PTA patient was independent from home with support of spouse. No DME in the home. Patient has insurance and PCP-gets to appointments without any issues. Patient has been preauthorized with the office protocol with [facility]. Case Manager will continue to follow for additional needs.                Expected Discharge Plan: Home w Home Health Services Barriers to Discharge: Continued Medical Work up   Patient Goals and CMS Choice Patient states their goals for this hospitalization and ongoing recovery are:: plan to return home with spouse   Expected Discharge Plan and Services In-house Referral: NA Discharge Planning Services: CM Consult Post Acute Care Choice: Home Health Living arrangements for the past 2 months: Single Family Home     Prior Living Arrangements/Services Living arrangements for the past 2 months: Single Family Home Lives with:: Spouse Patient language and need for interpreter reviewed:: Yes Do you feel safe going back to the place where you live?: Yes      Need for Family Participation in Patient Care: Yes (Comment) Care giver support system in place?: Yes (comment)   Criminal Activity/Legal Involvement Pertinent to Current Situation/Hospitalization: No - Comment as needed  Activities of Daily Living   ADL Screening (condition at time of admission) Independently performs ADLs?: Yes (appropriate for developmental age) Is the patient deaf or have difficulty hearing?: No Does the patient have difficulty seeing, even when wearing glasses/contacts?: No Does the patient have difficulty concentrating, remembering, or making decisions?: No  Permission  Sought/Granted Permission sought to share information with : Family Supports   Emotional Assessment Appearance:: Appears stated age Attitude/Demeanor/Rapport: Engaged Affect (typically observed): Appropriate Orientation: : Oriented to Self, Oriented to Place, Oriented to  Time, Oriented to Situation Alcohol / Substance Use: Not Applicable Psych Involvement: No (comment)  Admission diagnosis:  S/P aortic aneurysm repair [V25.366, Z86.79] Patient Active Problem List   Diagnosis Date Noted   S/P aortic aneurysm repair 10/24/2023   Severe aortic stenosis 08/30/2023   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 [facility]  [address] Phone: [phone] Fax: [phone]  Social Drivers of Health (SDOH) Social History: SDOH Screenings   Food Insecurity: No Food Insecurity (10/27/2023)  Housing: Low Risk  (10/27/2023)  Transportation Needs: No Transportation Needs (10/27/2023)  Utilities: Not At Risk (10/27/2023)  Social Connections: Unknown (01/12/2022)   Received from [facility]  Tobacco Use: Low Risk  (10/27/2023)    Readmission Risk Interventions     No data to display

## 2023-10-27 NOTE — Progress Notes (Signed)
 Arrived to 4E Room 4E18 from [facility] as a transfer.  Placed on cardiac monitoring and vital signs obtained.  Oriented/educated on unit and call bell usage.  Wife at bedside.  Call bell within reach and bed in lowest position.     10/27/23 1025  Vitals  Temp 97.8 F (36.6 C)  Temp Source Oral  BP (!) 145/87  MAP (mmHg) 104  BP Location Left Arm  BP Method Automatic  Patient Position (if appropriate) Lying  Pulse Rate 94  Pulse Rate Source Monitor  ECG Heart Rate 93  Resp 19  Level of Consciousness  Level of Consciousness Alert  MEWS COLOR  MEWS Score Color Green  Oxygen Therapy  SpO2 97 %  O2 Device Room Air  Pain Assessment  Pain Scale 0-10  Pain Score 0  MEWS Score  MEWS Temp 0  MEWS Systolic 0  MEWS Pulse 0  MEWS RR 0  MEWS LOC 0  MEWS Score 0

## 2023-10-27 NOTE — Plan of Care (Signed)
 Transferred from 2H to 4E post-operatively.  Alert and oriented. Ambulatory in hallways with walker and staff supervision.  Tolerating diet, no bowel movement noted.  Sternal precautions reinforced.   Problem: Education: Goal: Knowledge of General Education information will improve Description: Including pain rating scale, medication(s)/side effects and non-pharmacologic comfort measures Outcome: Progressing   Problem: Health Behavior/Discharge Planning: Goal: Ability to manage health-related needs will improve Outcome: Progressing   Problem: Clinical Measurements: Goal: Ability to maintain clinical measurements within normal limits will improve Outcome: Progressing Goal: Will remain free from infection Outcome: Progressing

## 2023-10-28 MED ORDER — POTASSIUM CHLORIDE CRYS ER 20 MEQ PO TBCR
20.0000 meq | EXTENDED_RELEASE_TABLET | Freq: Once | ORAL | Status: AC
  Administered 2023-10-28: 20 meq via ORAL
  Filled 2023-10-28: qty 1

## 2023-10-28 NOTE — Progress Notes (Signed)
 CARDIAC REHAB PHASE I   PRE:  Rate/Rhythm: 85 SR    BP: sitting 119/80    SpO2: 98 RA  MODE:  Ambulation: 470 ft   POST:  Rate/Rhythm: 101 ST    BP: sitting 143/79     SpO2: 98 RA  Pt moved out of bed independently with verbal cues. Walked with contact guard, steady pace, occasional sway. Denied c/o. To recliner.   Discussed with pt and wife IS, sternal precautions, exercise, diet, and CRPII. Pt receptive. Will refer to [place] CRPII.   Pt can walk with his wife, doing well. [id number]   Ethelda Chick BS, ACSM-CEP 10/28/2023 10:11 AM

## 2023-10-28 NOTE — Progress Notes (Addendum)
 [address]       [place] [address]             [phone]      4 Days Post-Op Procedure(s) (LRB): AORTIC VALVE REPLACEMENT (AVR) USING INSPIRIS RESILIA AORTIC VALVE SIZE (N/A) REPLACEMENT OF ASCENDING AORTA USING HEMASHIELD PLATIUM GRAFT SIZE (N/A) TRANSESOPHAGEAL ECHOCARDIOGRAM (TEE) (N/A) Subjective: Sitting up in bed eating breakfast. Patient with no new complaints this AM. States his nausea has improved and he was able to eat dinner last night.   Objective: Vital signs in last 24 hours: Temp:  [97.8 F (36.6 C)-98.4 F (36.9 C)] 98.1 F (36.7 C) (02/28 0404) Pulse Rate:  [71-94] 83 (02/28 0404) Cardiac Rhythm: Normal sinus rhythm (02/28 0445) Resp:  [14-19] 14 (02/28 0404) BP: (101-145)/(50-91) 101/68 (02/28 0404) SpO2:  [95 %-98 %] 98 % (02/28 0404) FiO2 (%):  [0 %] 0 % (02/27 1200) Weight:  [72.8 kg] 72.8 kg (02/28 0459)  Hemodynamic parameters for last 24 hours:    Intake/Output from previous day: 02/27 0701 - 02/28 0700 In: 253 [P.O.:240; I.V.:13] Out: 1000 [Urine:1000] Intake/Output this shift: No intake/output data recorded.  General appearance: alert, cooperative, and no distress Neurologic: intact Heart: regular rate and rhythm, S1, S2 normal, no murmur, click, rub or gallop Lungs: clear to auscultation bilaterally Abdomen: soft, non-tender; bowel sounds normal; no masses,  no organomegaly Extremities: extremities normal, atraumatic, no cyanosis or edema Wound: Clean and dry, no sign of infection  Lab Results: Recent Labs    10/25/23 1610 10/26/23 0518  WBC 10.5 8.3  HGB 9.1* 8.5*  HCT 26.6* 24.9*  PLT 97* 86*   BMET:  Recent Labs    10/25/23 1610 10/26/23 0518  NA 136 135  K 3.9 3.9  CL 102 103  CO2 28 29  GLUCOSE 135* 115*  BUN 16 17  CREATININE 0.83 0.78  CALCIUM 8.1* 8.2*    PT/INR:  Recent Labs    10/25/23 1153  LABPROT 16.4*  INR 1.3*   ABG    Component Value Date/Time   PHART 7.399  10/24/2023 2015   HCO3 25.2 10/24/2023 2015   TCO2 26 10/24/2023 2015   ACIDBASEDEF 1.0 10/24/2023 1817   O2SAT 98 10/24/2023 2015   CBG (last 3)  Recent Labs    10/26/23 2357 10/27/23 0454 10/27/23 0755  GLUCAP 109* 100* 124*    Assessment/Plan: S/P Procedure(s) (LRB): AORTIC VALVE REPLACEMENT (AVR) USING INSPIRIS RESILIA AORTIC VALVE SIZE (N/A) REPLACEMENT OF ASCENDING AORTA USING HEMASHIELD PLATIUM GRAFT SIZE (N/A) TRANSESOPHAGEAL ECHOCARDIOGRAM (TEE) (N/A)  Neuro: Pain controlled  CV: BP controlled, SBP 101 this AM on Lopressor 25mg  BID. NSR, HR 90s. Several premature beats, tele starting to look irregular when in the room. Will get EKG to confirm NSR.   Pulm: Saturating well on RA. Last CXR with likely left basilar atelectasis, no pleural effusion. Encourage IS and ambulation  GI: -BM, tolerating a diet. Passing flatus. Nausea resolved. Will give lactulose this AM.   Endo: No DM. Preop A1C 5.4. CBGs and SSI have been d/c'd  Renal: Last Cr 0.78, stable. Hematuria present, likely due to foley insertion. Urinary retention due to BPH, patient has a history of this as well. On Flomax 0.8mg  daily, foley in place. UO 1000cc/24hrs. Voiding trial tomorrow, if he fails will reinsert foley and leave in place at discharge with outpatient urology follow up. Patient sees Dr. Marlou Porch with [facility]. K 3.9, supplement. Under preop  weight, no lasix.   ID: No leukocytosis, afebrile  Expected postop ABLA: H/H 8.5/24.9, not clinically significant at this time. Reactive thrombocytopenia, last plt 86,000.   DVT Prophylaxis: Lovenox held due to thrombocytopenia  Dispo: Ambulating well, voiding trial tomorrow. Hopefully can d/c home next 48 hours.    LOS: 4 days    Jenny Reichmann, PA-C 10/28/2023   Chart reviewed, patient examined, agree with above.   He looks good overall. He is in sinus rhythm on Lopressor.  Wt is below preop.  Only real issue is urinary  retention due to BPH. He still has tea colored urine from foley. Will plan to give him a voiding trial tomorrow and if he can't urinate will send home with a foley to followup with his urologist. Continue Flomax 0.8 mg daily.

## 2023-10-28 NOTE — Plan of Care (Signed)
 Pt alert and oriented. No c/o pain overnight except for slight discomfort with getting up. Pt able to ambulate in room and to the bathroom with assistance getting out of bed. Encouraged pt to ambulate more today, transitioned tele monitor into tele box for continuous monitoring while ambulating. Sternal incision site CDI. Sternal precautions reiterated. Hematuria noted on pts foley output. Foley care and CHG bath given. Will continue to monitor pt.   Problem: Education: Goal: Will demonstrate proper wound care and an understanding of methods to prevent future damage Outcome: Progressing   Problem: Activity: Goal: Risk for activity intolerance will decrease Outcome: Progressing   Problem: Cardiac: Goal: Will achieve and/or maintain hemodynamic stability Outcome: Progressing   Problem: Clinical Measurements: Goal: Postoperative complications will be avoided or minimized Outcome: Progressing   Problem: Skin Integrity: Goal: Wound healing without signs and symptoms of infection Outcome: Progressing   Problem: Urinary Elimination: Goal: Ability to achieve and maintain adequate renal perfusion and functioning will improve Outcome: Progressing

## 2023-10-29 ENCOUNTER — Other Ambulatory Visit (HOSPITAL_COMMUNITY): Payer: Self-pay

## 2023-10-29 MED ORDER — METOPROLOL TARTRATE 25 MG PO TABS
25.0000 mg | ORAL_TABLET | Freq: Two times a day (BID) | ORAL | 1 refills | Status: DC
  Filled 2023-10-29: qty 60, 30d supply, fill #0

## 2023-10-29 MED ORDER — TAMSULOSIN HCL 0.4 MG PO CAPS
0.8000 mg | ORAL_CAPSULE | Freq: Every day | ORAL | 1 refills | Status: AC
  Filled 2023-10-29: qty 30, 15d supply, fill #0

## 2023-10-29 MED ORDER — TRAMADOL HCL 50 MG PO TABS
50.0000 mg | ORAL_TABLET | Freq: Four times a day (QID) | ORAL | 0 refills | Status: DC | PRN
  Filled 2023-10-29: qty 28, 7d supply, fill #0

## 2023-10-29 MED ORDER — ASPIRIN 81 MG PO TBEC
81.0000 mg | DELAYED_RELEASE_TABLET | Freq: Every day | ORAL | 12 refills | Status: AC
  Filled 2023-10-29: qty 30, 30d supply, fill #0

## 2023-10-29 NOTE — Progress Notes (Signed)
      [address]       [place] [address]             [phone]        Contacted via nursing... Foley removed without difficulty.... Patient has gross hematuria which has been present since initial foley placement.  He continues to pass blood clots, however he has been able to void without issue.  He is stable for discharge home today.  He will need to follow up with his Primary Urologist.  Lowella Dandy, PA-C 4:38 PM

## 2023-10-29 NOTE — Progress Notes (Signed)
      [address]       [place] [address]             [phone]      5 Days Post-Op Procedure(s) (LRB): AORTIC VALVE REPLACEMENT (AVR) USING INSPIRIS RESILIA AORTIC VALVE SIZE (N/A) REPLACEMENT OF ASCENDING AORTA USING HEMASHIELD PLATIUM GRAFT SIZE (N/A) TRANSESOPHAGEAL ECHOCARDIOGRAM (TEE) (N/A)  Subjective:  Patient doing well.  Sitting up in chair w/o complaints.  He is a little apprehensive of having foley removed.  I explained that in setting of valve prosthesis it would be best to not have foley in place as is an infection risk  Objective: Vital signs in last 24 hours: Temp:  [97.7 F (36.5 C)-98.4 F (36.9 C)] 98.1 F (36.7 C) (03/01 0922) Pulse Rate:  [80-97] 97 (03/01 0922) Cardiac Rhythm: Normal sinus rhythm (03/01 0800) Resp:  [14-20] 15 (03/01 0504) BP: (105-134)/(75-92) 122/75 (03/01 0504) SpO2:  [97 %-100 %] 98 % (03/01 0922) Weight:  [73 kg] 73 kg (03/01 0504)  Intake/Output from previous day: 02/28 0701 - 03/01 0700 In: -  Out: 800 [Urine:800] Intake/Output this shift: Total I/O In: -  Out: 275 [Urine:275]  General appearance: alert, cooperative, and no distress Heart: regular rate and rhythm Lungs: clear to auscultation bilaterally Abdomen: soft, non-tender; bowel sounds normal; no masses,  no organomegaly Extremities: extremities normal, atraumatic, no cyanosis or edema Wound: clean and dry  Lab Results: No results for input(s): "WBC", "HGB", "HCT", "PLT" in the last 72 hours. BMET: No results for input(s): "NA", "K", "CL", "CO2", "GLUCOSE", "BUN", "CREATININE", "CALCIUM" in the last 72 hours.  PT/INR: No results for input(s): "LABPROT", "INR" in the last 72 hours. ABG    Component Value Date/Time   PHART 7.399 10/24/2023 2015   HCO3 25.2 10/24/2023 2015   TCO2 26 10/24/2023 2015   ACIDBASEDEF 1.0 10/24/2023 1817   O2SAT 98 10/24/2023 2015   CBG (last 3)  Recent Labs    10/26/23 2357 10/27/23 0454  10/27/23 0755  GLUCAP 109* 100* 124*    Assessment/Plan: S/P Procedure(s) (LRB): AORTIC VALVE REPLACEMENT (AVR) USING INSPIRIS RESILIA AORTIC VALVE SIZE (N/A) REPLACEMENT OF ASCENDING AORTA USING HEMASHIELD PLATIUM GRAFT SIZE (N/A) TRANSESOPHAGEAL ECHOCARDIOGRAM (TEE) (N/A)  CV- NSR, BP is well controlled- continue Lopressor Pulm- no acute issues continue IS GU- hematuria, urinary retention.. patient feels urge to void at times... will attempt foley removal today per Dr. Garen Grams recommendations if unable to void will need foley replaced with plans to d/c home in place GI- constipation resolved Dispo- patient doing very well.. if he can void post foley removal will d/c home today... if not will require foley placement and will plan for d/c in AM   LOS: 5 days    Douglas Dandy, PA-C 10/29/2023

## 2023-11-01 DIAGNOSIS — Z48812 Encounter for surgical aftercare following surgery on the circulatory system: Secondary | ICD-10-CM | POA: Diagnosis not present

## 2023-11-02 ENCOUNTER — Telehealth (HOSPITAL_COMMUNITY): Payer: Self-pay | Admitting: *Deleted

## 2023-11-02 NOTE — Telephone Encounter (Signed)
 Cardiac Rehab Phase II referral faxed to [facility] per pt request. Ethelda Chick BS, ACSM-CEP 11/02/2023 1:47 PM r

## 2023-11-07 ENCOUNTER — Ambulatory Visit: Payer: 59 | Admitting: *Deleted

## 2023-11-07 DIAGNOSIS — Z4802 Encounter for removal of sutures: Secondary | ICD-10-CM

## 2023-11-07 NOTE — Progress Notes (Signed)
 Patient arrived for nurse visit to remove sutures post-AVR, replacement of ascending aorta by Dr. Laneta Simmers 2/24.  Two sutures removed with no signs or symptoms of infection noted.  Incisions well approximated. Patient tolerated suture removal well.  Patient and family instructed to keep the incision site clean and dry. Patient and family acknowledged instructions given.  All questions answered.

## 2023-11-13 NOTE — Progress Notes (Unsigned)
 Cardiology Office Note:  .   Date:  11/14/2023  ID:  Douglas Cole, DOB [birth year], MRN [id number] PCP: Douglas Bussing, MD  [facility] Providers Cardiologist:  Douglas Brothers, MD    History of Present Illness: .   Douglas Cole is a 63 y.o. male with a past medical history of prostate cancer, bicuspid aortic valve, ascending aortic aneurysm s/p repair 10/24/2023, severe aortic stenosis s/p repair with 23 mm Inspiris Resilia bioprosthetic valve on 10/24/2023.  10/24/2023 AVR with 23 mm Inspiris Resilia bioprosthetic valve & ascending aortic aneurysm repair using a 30 mm Hemashield platinum graft 10/20/2023 carotid duplex near normal with only minimal wall thickening 09/05/2023 right left heart cath normal coronary arteries 08/25/2023 calcium score of 0, aortic valve calcium score 4387, ascending aortic aneurysm 48 mm 08/19/2023 echo EF 60 to 65%, large calcified mass to right coronary cusp of the aortic valve felt to represent an atheroma, bicuspid aortic valve with severe calcification and stenosis, ascending aortic aneurysm  He established care with [facility] on 08/18/2023 at the behest of Douglas Cole for evaluation of ascending aortic aneurysm and cardiac murmur.  An echocardiogram was arranged and completed on 08/19/2023 revealing an EF of 60 to 65%, large calcified mass to right coronary cusp of the aortic valve felt to represent an atheroma, bicuspid aortic valve with severe calcification and stenosis, ascending aortic aneurysm 45 mm.  Calcium score was arranged revealing a calcium score of 0, ascending aortic aneurysm 48 mm with aortic valve calcium score of 4387.  He underwent a left heart cath on 09/05/2023 revealing no coronary artery disease.  He was subsequently admitted to [facility] on 10/24/2023 to 10/29/2023 and underwent a repair of his ascending aortic aneurysm utilizing a 30 mm Hemashield platinum graft as well as an AVR with a 23 mm Inspiris Resilia bioprosthetic valve.   His ICU stay was uncomplicated.  Did develop some hematuria on postop day 3, felt to be secondary to Foley insertion trauma, was ultimately discharged home.  He presents today accompanied by his wife for follow-up after recent hospitalization as outlined above.  He will follow-up with [facility] on 11/30/2023 >> repeat echocardiogram was arranged for 04/18/2024.  He is doing well from a cardiac perspective.  Maintaining sternal precautions.  He looks remarkably well, sternotomy site healing well.  He has been bothered by dizziness, it has improved somewhat but he checked his blood pressure at home this morning and a systolic was in the 90s. He denies chest pain, palpitations, dyspnea, pnd, orthopnea, n, v, dizziness, syncope, edema, weight gain, or early satiety.    ROS: ROS   Studies Reviewed: .        Cardiac Studies & Procedures   ______________________________________________________________________________________________ CARDIAC CATHETERIZATION  CARDIAC CATHETERIZATION 09/05/2023  Narrative Coronary angiography 09/04/2022: LM: Normal LAD: Normal Lcx: Normal RCA: Normal  Right heart catheterization 09/05/2023: RA: 5 mmHg RV: 44/0 mmHg PA: 31/15 mmHg, mPAP 24 mmHg PCW: 11 mmHg  AO sats: 95% PA sats: 73%  CO: 5.4 L/min CI: 2.9 L/min/m2  Continue management of severe aortic stenosis outpatient  Douglas Negus, MD  Findings Coronary Findings Diagnostic  Dominance: Right  Left Main Vessel is normal in caliber. Vessel is angiographically normal.  Left Anterior Descending Vessel is normal in caliber. Vessel is angiographically normal.  Left Circumflex Vessel is normal in caliber. Vessel is angiographically normal.  Right Coronary Artery Vessel is normal in caliber. Vessel is angiographically normal.  Intervention  No  interventions have been documented.     ECHOCARDIOGRAM  ECHOCARDIOGRAM COMPLETE 08/19/2023  Narrative ECHOCARDIOGRAM REPORT    Patient  Name:   Douglas Cole Date of Exam: 08/19/2023 Medical Rec #:  [id number]    Height:       67.0 in Accession #:    [id number]   Weight:       173.2 lb Date of Birth:  Oct 19, 1960    BSA:          1.902 m Patient Age:    62 years     BP:           136/78 mmHg Patient Gender: M            HR:           81 bpm. Exam Location:  [place]  Procedure: 2D Echo, Cardiac Doppler, Color Doppler and Strain Analysis  Indications:    Aneurysm of ascending aorta without rupture (HCC) [I71.21 (ICD-10-CM)]; Heart murmur [R01.1 (ICD-10-CM)]  History:        Patient has no prior history of Echocardiogram examinations. Arrythmias:Tachycardia; Signs/Symptoms:Murmur.  Sonographer:    Margreta Journey RDCS Referring Phys: [facility]  IMPRESSIONS   1. Left ventricular ejection fraction, by estimation, is 60 to 65%. The left ventricle has normal function. The left ventricle has no regional wall motion abnormalities. Left ventricular diastolic parameters were normal. The average left ventricular global longitudinal strain is -17.2 %. The global longitudinal strain is normal. 2. Right ventricular systolic function is normal. The right ventricular size is normal. There is normal pulmonary artery systolic pressure. 3. The mitral valve is normal in structure. No evidence of mitral valve regurgitation. No evidence of mitral stenosis. 4. Large calcified mass attached to rt coronary cusp of the aortic valve is noted measuring 21x12 mm. This most likely represents large atheroma.. The aortic valve is bicuspid. There is severe calcifcation of the aortic valve. Aortic valve regurgitation is not visualized. Severe aortic valve stenosis. Aortic valve area, by VTI measures 0.84 cm. Aortic valve mean gradient measures 45.4 mmHg. 5. Aneurysm of the ascending aorta, measuring 45 mm. 6. The inferior vena cava is normal in size with greater than 50% respiratory variability, suggesting right atrial pressure of 3  mmHg.  FINDINGS Left Ventricle: Left ventricular ejection fraction, by estimation, is 60 to 65%. The left ventricle has normal function. The left ventricle has no regional wall motion abnormalities. The average left ventricular global longitudinal strain is -17.2 %. The global longitudinal strain is normal. The left ventricular internal cavity size was normal in size. There is no left ventricular hypertrophy. Left ventricular diastolic parameters were normal.  Right Ventricle: The right ventricular size is normal. No increase in right ventricular wall thickness. Right ventricular systolic function is normal. There is normal pulmonary artery systolic pressure. The tricuspid regurgitant velocity is 2.62 m/s, and with an assumed right atrial pressure of 3 mmHg, the estimated right ventricular systolic pressure is 30.5 mmHg.  Left Atrium: Left atrial size was normal in size.  Right Atrium: Right atrial size was normal in size.  Pericardium: There is no evidence of pericardial effusion.  Mitral Valve: The mitral valve is normal in structure. No evidence of mitral valve regurgitation. No evidence of mitral valve stenosis.  Tricuspid Valve: The tricuspid valve is normal in structure. Tricuspid valve regurgitation is mild . No evidence of tricuspid stenosis.  Aortic Valve: Large calcified mass attached to rt coronary cusp of the aortic valve is noted measuring  21x12 mm. This most likely represents large atheroma. The aortic valve is bicuspid. There is severe calcifcation of the aortic valve. Aortic valve regurgitation is not visualized. Severe aortic stenosis is present. Aortic valve mean gradient measures 45.4 mmHg. Aortic valve peak gradient measures 74.4 mmHg. Aortic valve area, by VTI measures 0.84 cm.  Pulmonic Valve: The pulmonic valve was normal in structure. Pulmonic valve regurgitation is not visualized. No evidence of pulmonic stenosis.  Aorta: The aortic root is normal in size and  structure. There is an aneurysm involving the ascending aorta measuring 45 mm.  Venous: The inferior vena cava is normal in size with greater than 50% respiratory variability, suggesting right atrial pressure of 3 mmHg.  IAS/Shunts: No atrial level shunt detected by color flow Doppler.   LEFT VENTRICLE PLAX 2D LVIDd:         4.60 cm   Diastology LVIDs:         2.90 cm   LV e' medial:    10.10 cm/s LV PW:         1.00 cm   LV E/e' medial:  8.1 LV IVS:        1.00 cm   LV e' lateral:   10.60 cm/s LVOT diam:     2.20 cm   LV E/e' lateral: 7.7 LV SV:         76 LV SV Index:   40        2D Longitudinal Strain LVOT Area:     3.80 cm  2D Strain GLS Avg:     -17.2 %   RIGHT VENTRICLE             IVC RV Basal diam:  3.70 cm     IVC diam: 1.90 cm RV Mid diam:    3.10 cm RV S prime:     16.80 cm/s TAPSE (M-mode): 2.8 cm  LEFT ATRIUM             Index        RIGHT ATRIUM           Index LA diam:        3.40 cm 1.79 cm/m   RA Area:     22.30 cm LA Vol (A2C):   57.3 ml 30.12 ml/m  RA Volume:   60.20 ml  31.65 ml/m LA Vol (A4C):   56.4 ml 29.65 ml/m LA Biplane Vol: 56.8 ml 29.86 ml/m AORTIC VALVE AV Area (Vmax):    0.81 cm AV Area (Vmean):   0.76 cm AV Area (VTI):     0.84 cm AV Vmax:           431.40 cm/s AV Vmean:          315.800 cm/s AV VTI:            0.907 m AV Peak Grad:      74.4 mmHg AV Mean Grad:      45.4 mmHg LVOT Vmax:         92.03 cm/s LVOT Vmean:        63.067 cm/s LVOT VTI:          0.200 m LVOT/AV VTI ratio: 0.22  AORTA Ao Root diam: 3.20 cm Ao Asc diam:  4.50 cm  MITRAL VALVE               TRICUSPID VALVE MV Area (PHT): 4.52 cm    TR Peak grad:   27.5 mmHg MV Decel Time: 168 msec  TR Vmax:        262.00 cm/s MV E velocity: 81.90 cm/s MV A velocity: 81.23 cm/s  SHUNTS MV E/A ratio:  1.01        Systemic VTI:  0.20 m Systemic Diam: 2.20 cm  Gypsy Balsam MD Electronically signed by Gypsy Balsam MD Signature Date/Time: 08/19/2023/5:17:57  PM    Final   TEE  ECHO INTRAOPERATIVE TEE 10/24/2023  Narrative *INTRAOPERATIVE TRANSESOPHAGEAL REPORT *    Patient Name:   Douglas Cole   Date of Exam: 10/24/2023 Medical Rec #:  [id number]      Height:       66.0 in Accession #:    [id number]     Weight:       165.0 lb Date of Birth:  09/10/1960      BSA:          1.84 m Patient Age:    62 years       BP:           113/77 mmHg Patient Gender: M              HR:           65 bpm. Exam Location:  Anesthesiology  Transesophogeal exam was perform intraoperatively during surgical procedure. Patient was closely monitored under general anesthesia during the entirety of examination.  Indications:     AVR Performing Phys: [id number] BRYAN K BARTLE Diagnosing Phys: Roslynn Amble  Complications: No known complications during this procedure. POST-OP IMPRESSIONS _ Left Ventricle: The left ventricle is unchanged from pre-bypass, with normal systolic function. _ Right Ventricle: Normal function. _ Aorta: Ascending aortic graft noted. _ Left Atrial Appendage: The left atrial appendage appears unchanged from pre-bypass. _ Aortic Valve: Status post bioprosthetic aortic valve replacement, with normal function of the prosthesis. No visualized paravalvular leak with color doppler. Mean transvalvular gradient of . _ Mitral Valve: The mitral valve appears unchanged from pre-bypass. There is mild regurgitation. _ Tricuspid Valve: The tricuspid valve appears unchanged from pre-bypass. There is mild regurgitation. _ Pulmonic Valve: The pulmonic valve appears unchanged from pre-bypass. _ Interatrial Septum: The interatrial septum appears unchanged from pre-bypass. _ Pericardium: The pericardium appears unchanged from pre-bypass. _ Comments: Trace to small left pleural effusion.  PRE-OP FINDINGS Left Ventricle: The left ventricle has normal systolic function, with an ejection fraction of 55-60%. The cavity size was normal. The left ventricular  wall thickness was not assessed. There is no left ventricular hypertrophy. Left ventricular diastolic function could not be evaluated.   Right Ventricle: The right ventricle has normal systolic function. The cavity was normal. There is no increase in right ventricular wall thickness. Catheter present in the right ventricle.  Left Atrium: Left atrial size was not assessed. No left atrial/left atrial appendage thrombus was detected.  Right Atrium: Right atrial size was not assessed.  Interatrial Septum: No atrial level shunt detected by color flow Doppler. There is no evidence of a patent foramen ovale.  Pericardium: There is no evidence of pericardial effusion. There is no pleural effusion.  Mitral Valve: The mitral valve is normal in structure. Mitral valve regurgitation is trivial by color flow Doppler. There is no evidence of mitral stenosis.  Tricuspid Valve: The tricuspid valve was normal in structure. Tricuspid valve regurgitation is trivial by color flow Doppler. No evidence of tricuspid stenosis is present.  Aortic Valve: The aortic valve is bicuspid. Aortic valve regurgitation is trivial by color flow Doppler. There is severe stenosis of the  aortic valve with DI = 0.21.  Pulmonic Valve: The pulmonic valve was normal in structure No evidence of pumonic stenosis. Pulmonic valve regurgitation is not visualized by color flow Doppler.   Aorta: The aortic arch and descending aorta are normal in size and structure. There is moderate dilatation of the ascending aorta, measuring 43 mm.  Shunts: There is no evidence of an atrial septal defect.  +------------------+------------++ AORTIC VALVE                   +------------------+------------++ AV Vmax:          276.00 cm/s  +------------------+------------++ AV Vmean:         195.500 cm/s +------------------+------------++ AV VTI:           0.599 m      +------------------+------------++ AV Peak Grad:     30.5  mmHg    +------------------+------------++ AV Mean Grad:     18.0 mmHg    +------------------+------------++ LVOT Vmax:        54.40 cm/s   +------------------+------------++ LVOT Vmean:       31.700 cm/s  +------------------+------------++ LVOT VTI:         0.114 m      +------------------+------------++ LVOT/AV VTI ratio:0.19         +------------------+------------++   +-------------+------+ SHUNTS              +-------------+------+ Systemic VTI:0.11 m +-------------+------+   Roslynn Amble Electronically signed by Roslynn Amble Signature Date/Time: 10/24/2023/12:35:01 PM    Final    CT SCANS  CT CARDIAC SCORING (SELF PAY ONLY) 08/25/2023  Addendum 09/09/2023  9:31 PM ADDENDUM REPORT: 09/09/2023 21:29  EXAM: OVER-READ INTERPRETATION  CT CHEST  The following report is an over-read performed by radiologist Dr. Aram Candela of [facility] on 09/09/2023. This over-read does not include interpretation of cardiac or coronary anatomy or pathology. The coronary calcium score/coronary CTA interpretation by the cardiologist is attached.  COMPARISON:  June 16, 2023  FINDINGS: Cardiovascular: There are no significant extracardiac vascular findings.  Mediastinum/Nodes: There are no enlarged lymph nodes within the visualized mediastinum.  Lungs/Pleura: There is no pleural effusion. The visualized lungs appear clear.  Upper abdomen: Gallbladder is surgically absent. A small amount of subsequent pneumobilia is noted.  Musculoskeletal/Chest wall: No chest wall mass or suspicious osseous findings within the visualized chest.  IMPRESSION: No acute or significant extracardiac findings within the visualized chest.   Electronically Signed By: Aram Candela M.D. On: 09/09/2023 21:29  Narrative : CLINICAL DATA:  Cardiovascular Disease Risk stratification  EXAM:  Coronary Calcium Score  TECHNIQUE:  A gated,  non-contrast computed tomography scan of the heart was  performed using 3mm slice thickness. Axial images were analyzed on a  dedicated workstation. Calcium scoring of the coronary arteries was  performed using the Agatston method.  FINDINGS:  Coronary Calcium Score:  Left main: 0  Left anterior descending artery: 0  Left circumflex artery: 0  Right coronary artery: 0  Total: 0  Percentile: <1  Pericardium: Normal.  Ascending Aorta: Aneurysm - 48 mm.  Pulmonary artery: Normal caliber  Aortic Valve: Calcium score 4387  Non-cardiac: See separate report from [facility].  IMPRESSION:  Coronary calcium score of 0. This was <1 percentile for age-, race-,  and sex-matched controls.  Ascending aortic aneurysm - 48 mm.  Aortic valve calcium score - 4387.  RECOMMENDATIONS:  Coronary artery calcium (CAC) score is a strong predictor of  incident coronary heart disease (CHD) and  provides predictive  information beyond traditional risk factors. CAC scoring is  reasonable to use in the decision to withhold, postpone, or initiate  statin therapy in intermediate-risk or selected borderline-risk  asymptomatic adults (age 32-75 years and LDL-C >=70 to <190 mg/dL)  who do not have diabetes or established atherosclerotic  cardiovascular disease (ASCVD).* In intermediate-risk (10-year ASCVD  risk >=7.5% to <20%) adults or selected borderline-risk (10-year  ASCVD risk >=5% to <7.5%) adults in whom a CAC score is measured for  the purpose of making a treatment decision the following  recommendations have been made:  If CAC=0, it is reasonable to withhold statin therapy and reassess  in 5 to 10 years, as long as higher risk conditions are absent  (diabetes mellitus, family history of premature CHD in first degree  relatives (males <55 years; females <65 years), cigarette smoking,  or LDL >=190 mg/dL).  If CAC is 1 to 99, it is reasonable to initiate  statin therapy for  patients >=82 years of age.  If CAC is >=100 or >=75th percentile, it is reasonable to initiate  statin therapy at any age.  Cardiology referral should be considered for patients with CAC  scores >=400 or >=75th percentile.  *2018 AHA/ACC/AACVPR/AAPA/ABC/ACPM/ADA/AGS/APhA/ASPC/NLA/PCNA  Guideline on the Management of Blood Cholesterol: A Report of the  American College of Cardiology/American Heart Association Task Force  on Clinical Practice Guidelines. J Am Coll Cardiol.  2019;73(24):3168-3209.  Electronically Signed: By: Gypsy Balsam M.D. On: 08/30/2023 10:16     ______________________________________________________________________________________________      Risk Assessment/Calculations:             Physical Exam:   VS:  BP 110/66 (BP Location: Left Arm, Patient Position: Sitting)   Pulse 65   Ht 5\' 6"  (1.676 m)   Wt 165 lb 3.2 oz (74.9 kg)   SpO2 98%   BMI 26.66 kg/m    Wt Readings from Last 3 Encounters:  11/14/23 165 lb 3.2 oz (74.9 kg)  10/29/23 161 lb (73 kg)  10/20/23 168 lb 11.2 oz (76.5 kg)    GEN: Well nourished, well developed in no acute distress NECK: No JVD; No carotid bruits CARDIAC: RRR, no murmurs, rubs, gallops RESPIRATORY:  Clear to auscultation without rales, wheezing or rhonchi  ABDOMEN: Soft, non-tender, non-distended EXTREMITIES:  No edema; No deformity   ASSESSMENT AND PLAN: .   Severe aortic stenosis with bicuspid aortic valve-s/p AVR on 10/24/2023.  He is healing well.  Repeat echocardiogram scheduled for 12/06/2023.  He will see his TCTS surgeon on 11/30/2023.  He is adhering to sternal precautions.  Sternotomy healing well.  He has plans to see cardiac rehab tomorrow.  We discussed SBE, the need for this prior to any dental procedures.  Currently on metoprolol however he has been bothered by dizziness, will decrease to 12.5 mg twice daily.  Ascending aortic aneurysm repair-blood pressure is well-controlled,  he is not a smoker.  Dyslipidemia-LDL is elevated greater than 160 however he has no known coronary artery disease and he is not interested in any lipid-lowering medications at this time.       Dispo: SBE for dental procedures. Decrease metoprolol to 12.5 mg twice daily. Follow up with general cardiology in 6 months.   Signed, Flossie Dibble, NP

## 2023-11-14 ENCOUNTER — Encounter: Payer: Self-pay | Admitting: Cardiology

## 2023-11-14 ENCOUNTER — Ambulatory Visit: Payer: 59 | Attending: Cardiology | Admitting: Cardiology

## 2023-11-14 VITALS — BP 110/66 | HR 65 | Ht 66.0 in | Wt 165.2 lb

## 2023-11-14 DIAGNOSIS — I7121 Aneurysm of the ascending aorta, without rupture: Secondary | ICD-10-CM | POA: Diagnosis not present

## 2023-11-14 DIAGNOSIS — I350 Nonrheumatic aortic (valve) stenosis: Secondary | ICD-10-CM | POA: Diagnosis not present

## 2023-11-14 DIAGNOSIS — Z952 Presence of prosthetic heart valve: Secondary | ICD-10-CM | POA: Diagnosis not present

## 2023-11-14 DIAGNOSIS — E782 Mixed hyperlipidemia: Secondary | ICD-10-CM

## 2023-11-14 MED ORDER — METOPROLOL TARTRATE 25 MG PO TABS: 12.5000 mg | ORAL_TABLET | Freq: Two times a day (BID) | ORAL | 3 refills | Status: AC

## 2023-11-14 NOTE — Patient Instructions (Signed)
 Medication Instructions:  Your physician has recommended you make the following change in your medication:  Decrease Metoprolol to 12.5 mg two times daily  *If you need a refill on your cardiac medications before your next appointment, please call your pharmacy*   Lab Work: NONE If you have labs (blood work) drawn today and your tests are completely normal, you will receive your results only by: MyChart Message (if you have MyChart) OR A paper copy in the mail If you have any lab test that is abnormal or we need to change your treatment, we will call you to review the results.   Testing/Procedures: NONE   Follow-Up: At [facility], you and your health needs are our priority.  As part of our continuing mission to provide you with exceptional heart care, we have created designated Provider Care Teams.  These Care Teams include your primary Cardiologist (physician) and Advanced Practice Providers (APPs -  Physician Assistants and Nurse Practitioners) who all work together to provide you with the care you need, when you need it.  We recommend signing up for the patient portal called "MyChart".  Sign up information is provided on this After Visit Summary.  MyChart is used to connect with patients for Virtual Visits (Telemedicine).  Patients are able to view lab/test results, encounter notes, upcoming appointments, etc.  Non-urgent messages can be sent to your provider as well.   To learn more about what you can do with MyChart, go to [email or web address].    Your next appointment:   6 month(s)  Provider:   Belva Crome, MD, [facility]  Other Instructions Check and record BP two times daily, once in the morning 1-2 hours after taking medication and once in the evening before dinner. You can drop off log at our office or send it through MyChart.

## 2023-11-29 ENCOUNTER — Other Ambulatory Visit: Payer: Self-pay | Admitting: Surgery

## 2023-11-29 DIAGNOSIS — I7121 Aneurysm of the ascending aorta, without rupture: Secondary | ICD-10-CM

## 2023-11-30 ENCOUNTER — Ambulatory Visit (INDEPENDENT_AMBULATORY_CARE_PROVIDER_SITE_OTHER): Payer: 59 | Admitting: Surgery

## 2023-11-30 ENCOUNTER — Ambulatory Visit
Admission: RE | Admit: 2023-11-30 | Discharge: 2023-11-30 | Disposition: A | Discharge status: Home / Self Care | Source: Ambulatory Visit | Attending: Surgery | Admitting: Surgery

## 2023-11-30 ENCOUNTER — Encounter: Payer: Self-pay | Admitting: Surgery

## 2023-11-30 VITALS — BP 150/83 | HR 65 | Resp 18 | Ht 66.0 in | Wt 163.0 lb

## 2023-11-30 DIAGNOSIS — I7121 Aneurysm of the ascending aorta, without rupture: Secondary | ICD-10-CM

## 2023-11-30 DIAGNOSIS — Z09 Encounter for follow-up examination after completed treatment for conditions other than malignant neoplasm: Secondary | ICD-10-CM

## 2023-11-30 NOTE — Progress Notes (Signed)
   HPI: Patient returns for routine postoperative follow-up having undergone supracoronary replacement of an ascending aortic aneurysm under deep hypothermic circulatory arrest and aortic valve replacement using a 23 mm Edwards INSPIRIS RESILIA pericardial valve on 10/24/2023. The patient's early postoperative recovery while in the hospital was notable for an uncomplicated postoperative course. Since hospital discharge the patient reports that he has been feeling well.  He is walking daily without chest pain or shortness of breath.  He is scheduled to begin cardiac rehab next week..   Current Outpatient Medications  Medication Sig Dispense Refill   aspirin EC 81 MG tablet Take 1 tablet (81 mg total) by mouth daily. Swallow whole. 30 tablet 12   metoprolol tartrate (LOPRESSOR) 25 MG tablet Take 0.5 tablets (12.5 mg total) by mouth 2 (two) times daily. 90 tablet 3   tamsulosin (FLOMAX) 0.4 MG CAPS capsule Take 2 capsules (0.8 mg total) by mouth daily. (Patient taking differently: Take 0.4 mg by mouth daily.) 30 capsule 1   No current facility-administered medications for this visit.    Physical Exam: BP (!) 150/83 (BP Location: Left Arm)   Pulse 65   Resp 18   Ht 5\' 6"  (1.676 m)   Wt 163 lb (73.9 kg)   SpO2 98% Comment: RA  BMI 26.31 kg/m  He looks well. Cardiac exam shows a regular rate and rhythm with normal heart sounds.  There is no murmur. Lungs are clear. The chest incision is well-healed and the sternum is stable. There is no peripheral edema.  Diagnostic Tests:  Narrative & Impression  CLINICAL DATA:  History of aortic valve replacement   EXAM: CHEST - 2 VIEW   COMPARISON:  X-ray 10/26/2023.   FINDINGS: Status post median sternotomy. Prosthetic aortic valve. No consolidation, pneumothorax or effusion. No edema. Normal cardiopericardial silhouette. Degenerative changes of the spine on lateral view. Surgical clips in the right upper quadrant.   IMPRESSION: Postop  chest.  No acute cardiopulmonary disease.     Electronically Signed   By: Karen Kays M.D.   On: 11/30/2023 12:30      Impression:  He is doing well 5 weeks following his surgery.  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  I think he is fine to be in cardiac rehab.  He is scheduled to have a postoperative echocardiogram next week and I will plan to see him back in the office in 1 month.  He will need a CTA of the chest in 1 year for follow-up of the remainder of his aorta.  Plan:  I will see him back in 1 month for follow-up and to discuss the results of his postoperative echo.  We will schedule a CT of the chest for 1 year.   Alleen Borne, MD [facility] [phone]

## 2023-12-06 ENCOUNTER — Ambulatory Visit: Payer: 59

## 2023-12-07 ENCOUNTER — Encounter: Payer: Self-pay | Admitting: *Deleted

## 2023-12-07 ENCOUNTER — Telehealth: Payer: Self-pay | Admitting: *Deleted

## 2023-12-07 NOTE — Telephone Encounter (Signed)
 [employer] form filled out for patient to return to work part time beginning 12/12/2023 with restrictions of no lifting, pushing, pulling greater than 25lbs until 12 weeks, per Dr. Laneta Simmers. Form faxed to [phone].

## 2023-12-28 ENCOUNTER — Ambulatory Visit: Attending: Cardiology

## 2023-12-28 DIAGNOSIS — I77810 Thoracic aortic ectasia: Secondary | ICD-10-CM

## 2023-12-28 DIAGNOSIS — I517 Cardiomegaly: Secondary | ICD-10-CM | POA: Diagnosis not present

## 2023-12-28 DIAGNOSIS — I361 Nonrheumatic tricuspid (valve) insufficiency: Secondary | ICD-10-CM

## 2023-12-28 DIAGNOSIS — Z952 Presence of prosthetic heart valve: Secondary | ICD-10-CM | POA: Diagnosis not present

## 2023-12-29 LAB — ECHOCARDIOGRAM COMPLETE
AV Mean grad: 12 mmHg
AV Peak grad: 21.7 mmHg
Ao pk vel: 2.33 m/s
Area-P 1/2: 3.68 cm2
S' Lateral: 3.2 cm

## 2024-01-04 ENCOUNTER — Encounter: Payer: Self-pay | Admitting: Surgery

## 2024-01-04 ENCOUNTER — Ambulatory Visit: Payer: Self-pay | Attending: Surgery | Admitting: Surgery

## 2024-01-04 VITALS — BP 165/101 | HR 85 | Resp 18 | Ht 66.0 in | Wt 163.0 lb

## 2024-01-04 DIAGNOSIS — Z09 Encounter for follow-up examination after completed treatment for conditions other than malignant neoplasm: Secondary | ICD-10-CM

## 2024-01-04 DIAGNOSIS — R000 Tachycardia, unspecified: Secondary | ICD-10-CM | POA: Diagnosis not present

## 2024-01-04 NOTE — Progress Notes (Signed)
 HPI:  Patient returns for routine postoperative follow-up having undergone supracoronary replacement of an ascending aortic aneurysm under deep hypothermic circulatory arrest and aortic valve replacement using a 23 mm Edwards INSPIRIS RESILIA pericardial valve on 10/24/2023.   Current Outpatient Medications  Medication Sig Dispense Refill   aspirin  EC 81 MG tablet Take 1 tablet (81 mg total) by mouth daily. Swallow whole. 30 tablet 12   metoprolol  tartrate (LOPRESSOR ) 25 MG tablet Take 0.5 tablets (12.5 mg total) by mouth 2 (two) times daily. 90 tablet 3   tamsulosin  (FLOMAX ) 0.4 MG CAPS capsule Take 2 capsules (0.8 mg total) by mouth daily. (Patient taking differently: Take 0.4 mg by mouth daily.) 30 capsule 1   No current facility-administered medications for this visit.     Physical Exam: BP (!) 165/101 (BP Location: Left Arm)   Pulse 85   Resp 18   Ht 5\' 6"  (1.676 m)   Wt 163 lb (73.9 kg)   SpO2 98%   BMI 26.31 kg/m    Diagnostic Tests:    ECHOCARDIOGRAM REPORT       Patient Name:   Douglas Cole Date of Exam: 12/28/2023  Medical Rec #:  [id number]    Height:       66.0 in  Accession #:    [id number]   Weight:       163.0 lb  Date of Birth:  09/10/1960    BSA:          1.833 m  Patient Age:    62 years     BP:           110/66 mmHg  Patient Gender: M            HR:           63 bpm.  Exam Location:  [facility]   Procedure: 2D Echo, Cardiac Doppler, Color Doppler and Strain Analysis  (Both            Spectral and Color Flow Doppler were utilized during  procedure).   Indications:   S/P aortic valve replacement [Z95.2 (ICD-10-CM)]    History:        Patient has prior history of Echocardiogram examinations,  most                 recent 08/19/2023. Aortic Valve Disease; Risk                  Factors:Dyslipidemia. Ascending aortic aneurysm repair  using a                  30 mm Hemashield platinum graft.                  Aortic Valve: 23 mm Inspiris Resilia  bioprosthetic valve  is                 present in the aortic position. Procedure Date: 10/24/2023.    Sonographer:    Erminia Hazel RDCS  Referring Phys: [id number] ROBERT J KRASOWSKI   IMPRESSIONS     1. History of aortic valve replaced with 23 mm Inspiris Resilia  bioprosthetic valve. Procedure Date: 10/24/2023. Well seated valve with  mean gradient measures 12.0 mmHg, Vmax measures 2.33 m/s and Dimensionless  Index 0.5.   2. There is mild dilatation of the aortic root, measuring 39 mm.  Ascending aorta s/p 30mm Hemashield graft repair in place   3. Left ventricular ejection fraction, by estimation, is 60 to  65%. The  left ventricle has normal function. The left ventricle has no regional  wall motion abnormalities. There is mild left ventricular hypertrophy.  Left ventricular diastolic parameters  were normal. The average left ventricular global longitudinal strain is  -17.5 %.   4. Right ventricular systolic function is normal. The right ventricular  size is normal. There is normal pulmonary artery systolic pressure.   5. The mitral valve is grossly normal. Trivial mitral valve  regurgitation. No evidence of mitral stenosis.   6. Technically difficult study.   Comparison(s): In comparison to intraoperative TEE 10-24-2023 reported  normal LVEF 55 to 60%, prosthetic aortic valve mean transvalvular gradient  6 mmHg. Mild mitral regurgitation, mild TR.   FINDINGS   Left Ventricle: Left ventricular ejection fraction, by estimation, is 60  to 65%. The left ventricle has normal function. The left ventricle has no  regional wall motion abnormalities. The average left ventricular global  longitudinal strain is -17.5 %.  The left ventricular internal cavity size was normal in size. There is  mild left ventricular hypertrophy. Left ventricular diastolic parameters  were normal.   Right Ventricle: The right ventricular size is normal. Right vetricular  wall thickness was not well  visualized. Right ventricular systolic  function is normal. There is normal pulmonary artery systolic pressure.  The tricuspid regurgitant velocity is 2.21  m/s, and with an assumed right atrial pressure of 3 mmHg, the estimated  right ventricular systolic pressure is 22.5 mmHg.   Left Atrium: Left atrial size was normal in size.   Right Atrium: Right atrial size was normal in size.   Pericardium: There is no evidence of pericardial effusion.   Mitral Valve: The mitral valve is grossly normal. Trivial mitral valve  regurgitation. No evidence of mitral valve stenosis.   Tricuspid Valve: The tricuspid valve is grossly normal. Tricuspid valve  regurgitation is mild . No evidence of tricuspid stenosis.   Aortic Valve: The aortic valve has been repaired/replaced. Aortic valve  regurgitation is not visualized. Aortic valve mean gradient measures 12.0  mmHg. Aortic valve peak gradient measures 21.7 mmHg. There is a 23 mm  Inspiris Resilia bioprosthetic valve  present in the aortic position. Procedure Date: 10/24/2023.   Pulmonic Valve: The pulmonic valve was not well visualized. Pulmonic valve  regurgitation is not visualized. No evidence of pulmonic stenosis.   Aorta: The aortic root/ascending aorta has been repaired/replaced. There  is mild dilatation of the aortic root, measuring 39 mm.   Venous: The inferior vena cava was not well visualized.   IAS/Shunts: The interatrial septum was not well visualized.     LEFT VENTRICLE  PLAX 2D  LVIDd:         4.70 cm Diastology  LVIDs:         3.20 cm LV e' medial:    9.79 cm/s  LV PW:         1.00 cm LV E/e' medial:  7.4  LV IVS:        1.00 cm LV e' lateral:   16.20 cm/s                         LV E/e' lateral: 4.5                           2D Longitudinal Strain  2D Strain GLS Avg:     -17.5 %   RIGHT VENTRICLE            IVC  RV Basal diam:  3.50 cm    IVC diam: 1.80 cm  RV Mid diam:    3.30 cm  RV S prime:      9.65 cm/s  TAPSE (M-mode): 1.8 cm   LEFT ATRIUM             Index        RIGHT ATRIUM           Index  LA diam:        3.90 cm 2.13 cm/m   RA Area:     21.40 cm  LA Vol (A2C):   46.6 ml 25.42 ml/m  RA Volume:   57.80 ml  31.53 ml/m  LA Vol (A4C):   45.6 ml 24.87 ml/m  LA Biplane Vol: 48.1 ml 26.24 ml/m   AORTIC VALVE  AV Vmax:           233.00 cm/s  AV Vmean:          145.400 cm/s  AV VTI:            0.456 m  AV Peak Grad:      21.7 mmHg  AV Mean Grad:      12.0 mmHg  LVOT Vmax:         115.75 cm/s  LVOT Vmean:        74.675 cm/s  LVOT VTI:          0.240 m  LVOT/AV VTI ratio: 0.53    AORTA  Ao Root diam: 3.90 cm  Ao Asc diam:  3.10 cm   MITRAL VALVE               TRICUSPID VALVE  MV Area (PHT): 3.68 cm    TR Peak grad:   19.5 mmHg  MV Decel Time: 206 msec    TR Vmax:        221.00 cm/s  MV E velocity: 72.30 cm/s  MV A velocity: 55.80 cm/s  SHUNTS  MV E/A ratio:  1.30        Systemic VTI: 0.24 m   Douglas Cole  Electronically signed by Douglas Cole  Signature Date/Time: 12/29/2023/4:33:08 PM      Impression:  He is progressing well almost 3 months out from his surgery.  He has been going to cardiac rehab at [facility].  He did develop some post exercise sinus tachycardia today at cardiac rehab which resolved with rest.  He had not taken his beta-blocker for 24 hours.  He is very motivated to continue physical activity and has been watching his diet closely.  I think he can return to work without restrictions at this time.  Plan:  He will continue to follow-up with cardiology and will return to see me if he has any problems with his incision.   Douglas Lightning, MD [facility] [phone]

## 2024-01-05 ENCOUNTER — Telehealth: Payer: Self-pay

## 2024-01-05 NOTE — Telephone Encounter (Signed)
-----   Message from Ralene Burger sent at 12/30/2023  8:11 AM EDT ----- Echocardiogram showed normal function of the prosthetic valve overall looks good

## 2024-01-05 NOTE — Telephone Encounter (Signed)
 Patient notified through my char.

## 2024-05-24 ENCOUNTER — Ambulatory Visit: Admitting: Cardiology

## 2024-05-30 ENCOUNTER — Telehealth: Payer: Self-pay | Admitting: Cardiology

## 2024-05-30 NOTE — Telephone Encounter (Signed)
 FYI I called pt to schedule from recall. Pt states he does not feel he needs the appt. He feels like he should just be able to go to his PCP for his medical needs since we all do the same thing. I did explain to him about his medications being filled and he said he has refills left so he is good.
